# Patient Record
Sex: Female | Born: 1970 | Race: Black or African American | Hispanic: No | Marital: Single | Smoking: Never smoker
Health system: Southern US, Community
[De-identification: ages and names within clinical notes are randomized; demographics above are authoritative.]

## PROBLEM LIST (undated history)

## (undated) DIAGNOSIS — I10 Essential (primary) hypertension: Secondary | ICD-10-CM

## (undated) DIAGNOSIS — B019 Varicella without complication: Secondary | ICD-10-CM

## (undated) HISTORY — DX: Varicella without complication: B01.9

---

## 1999-07-02 ENCOUNTER — Other Ambulatory Visit: Admission: RE | Admit: 1999-07-02 | Discharge: 1999-07-02 | Payer: Self-pay | Admitting: Obstetrics and Gynecology

## 2003-09-05 ENCOUNTER — Other Ambulatory Visit: Admission: RE | Admit: 2003-09-05 | Discharge: 2003-09-05 | Payer: Self-pay | Admitting: Obstetrics and Gynecology

## 2004-04-15 ENCOUNTER — Emergency Department (HOSPITAL_COMMUNITY): Admission: EM | Admit: 2004-04-15 | Discharge: 2004-04-15 | Payer: Self-pay | Admitting: Emergency Medicine

## 2011-11-04 ENCOUNTER — Emergency Department (HOSPITAL_BASED_OUTPATIENT_CLINIC_OR_DEPARTMENT_OTHER): Payer: Self-pay

## 2011-11-04 ENCOUNTER — Encounter (HOSPITAL_BASED_OUTPATIENT_CLINIC_OR_DEPARTMENT_OTHER): Payer: Self-pay | Admitting: *Deleted

## 2011-11-04 ENCOUNTER — Emergency Department (HOSPITAL_BASED_OUTPATIENT_CLINIC_OR_DEPARTMENT_OTHER)
Admission: EM | Admit: 2011-11-04 | Discharge: 2011-11-04 | Disposition: A | Discharge status: Home / Self Care | Payer: Self-pay | Attending: Emergency Medicine | Admitting: Emergency Medicine

## 2011-11-04 DIAGNOSIS — M76899 Other specified enthesopathies of unspecified lower limb, excluding foot: Secondary | ICD-10-CM | POA: Insufficient documentation

## 2011-11-04 DIAGNOSIS — M719 Bursopathy, unspecified: Secondary | ICD-10-CM

## 2011-11-04 MED ORDER — PREDNISONE 10 MG PO TABS: 20.0000 mg | ORAL_TABLET | Freq: Every day | ORAL | Status: DC

## 2011-11-04 NOTE — ED Notes (Signed)
Patient states she has right hip pain for the last 3 months.  No known injury.  States pain has progressively gotten worse, and pain increases with rainy weather.

## 2011-11-05 NOTE — ED Provider Notes (Signed)
History     CSN: [id number]  Arrival date & time 11/04/11  2130   First MD Initiated Contact with Patient 11/04/11 [phone]      Chief Complaint  Patient presents with  . Hip Pain    right    (Consider location/radiation/quality/duration/timing/severity/associated sxs/prior treatment) HPI  Right hip pain for a month.  Pain increases with certain positions.  No recent fall or injury.  No redness, warmth, or swelling.  Patient ambulatory without difficulty.    History reviewed. No pertinent past medical history.  History reviewed. No pertinent past surgical history.  No family history on file.  History  Substance Use Topics  . Smoking status: Never Smoker   . Smokeless tobacco: Not on file  . Alcohol Use: No    OB History    Grav Para Term Preterm Abortions TAB SAB Ect Mult Living                  Review of Systems  All other systems reviewed and are negative.    Allergies  Review of patient's allergies indicates no known allergies.  Home Medications   Current Outpatient Rx  Name Route Sig Dispense Refill  . IBUPROFEN 800 MG PO TABS Oral Take 800 mg by mouth every 8 (eight) hours as needed.    Marland Kitchen PREDNISONE 10 MG PO TABS Oral Take 2 tablets (20 mg total) by mouth daily. 15 tablet 0    BP 132/80  Pulse 79  Temp 98.2 F (36.8 C) (Oral)  Resp 20  Ht 5' (1.524 m)  Wt 219 lb (99.338 kg)  BMI 42.77 kg/m2  SpO2 100%  LMP 10/15/2011  Physical Exam  Nursing note and vitals reviewed. Constitutional: She is oriented to person, place, and time. She appears well-developed and well-nourished.  HENT:  Head: Normocephalic and atraumatic.  Eyes: Conjunctivae and EOM are normal. Pupils are equal, round, and reactive to light.  Neck: Normal range of motion. Neck supple.  Abdominal: Soft. There is no tenderness.  Musculoskeletal:       Some lateral right hip tenderness to palpation.  Full arom right hip, knee, and ankle.  DP and pt 2+.  Sensation intact throughout  lower extremity.    Neurological: She is alert and oriented to person, place, and time.  Skin: Skin is warm and dry.  Psychiatric: She has a normal mood and affect.    ED Course  Procedures (including critical care time)  Labs Reviewed - No data to display Dg Hip Complete Right  11/04/2011  *RADIOLOGY REPORT*  Clinical Data: Right hip pain.  RIGHT HIP - COMPLETE 2+ VIEW  Comparison:  None.  Findings:  There is no evidence of hip fracture or dislocation. There is no evidence of arthropathy or other focal bone abnormality.Pelvic phleboliths.  IMPRESSION: Negative.  Original Report Authenticated By: Elsie Stain, M.D.     1. Bursitis       MDM          Hilario Quarry, MD 11/05/11 [phone]

## 2012-08-16 ENCOUNTER — Ambulatory Visit: Payer: Self-pay | Admitting: Family Medicine

## 2012-09-09 ENCOUNTER — Encounter: Payer: Self-pay | Admitting: Family Medicine

## 2012-09-09 ENCOUNTER — Ambulatory Visit (INDEPENDENT_AMBULATORY_CARE_PROVIDER_SITE_OTHER): Payer: BC Managed Care – PPO | Admitting: Family Medicine

## 2012-09-09 VITALS — BP 150/110 | HR 88 | Temp 99.0°F | Resp 12 | Ht 60.5 in | Wt 220.0 lb

## 2012-09-09 DIAGNOSIS — I10 Essential (primary) hypertension: Secondary | ICD-10-CM | POA: Insufficient documentation

## 2012-09-09 MED ORDER — AMLODIPINE BESYLATE 5 MG PO TABS: 5.0000 mg | ORAL_TABLET | Freq: Every day | ORAL | Status: DC

## 2012-09-09 NOTE — Progress Notes (Signed)
  Subjective:    Patient ID: Danielle Novak, female    DOB: [birth year], 42 y.o.   MRN: [id number]  HPI  Patient here to establish care She has not seen a physician in about 7 years other than urgent care visit for presumed right hip bursitis last summer. Her blood pressure was reportedly elevated then. No significant headaches. No chest pains. No peripheral edema. No nonsteroidal use. No alcohol use.  Patient does not take any medications. No chronic medical problems. No prior surgeries. Family history significant for mother with hypertension.  Patient is single. 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 Nonsmoker. No alcohol.  Past Medical History  Diagnosis Date  . Chicken pox     age 65   History reviewed. No pertinent past surgical history.  reports that she has never smoked. She does not have any smokeless tobacco history on file. She reports that she does not drink alcohol or use illicit drugs. family history includes Diabetes in her maternal grandmother and Hypertension in her mother. No Known Allergies   Review of Systems  Constitutional: Negative for fatigue and unexpected weight change.  Eyes: Negative for visual disturbance.  Respiratory: Negative for cough, chest tightness, shortness of breath and wheezing.   Cardiovascular: Negative for chest pain, palpitations and leg swelling.  Gastrointestinal: Negative for abdominal pain.  Endocrine: Negative for polydipsia and polyuria.  Genitourinary: Negative for dysuria.  Musculoskeletal: Negative for back pain.  Neurological: Negative for dizziness, seizures, syncope, weakness, light-headedness and headaches.  Psychiatric/Behavioral: Negative for dysphoric mood.       Objective:   Physical Exam  Constitutional: She appears well-developed and well-nourished.  Neck: Neck supple. No thyromegaly present.  Cardiovascular: Normal rate and regular rhythm.  Exam reveals no gallop.   Pulmonary/Chest: Effort normal and breath sounds normal. No  respiratory distress. She has no wheezes. She has no rales.  Musculoskeletal: She exhibits no edema.          Assessment & Plan:  Hypertension. Currently untreated. Start amlodipine 5 mg daily. Lose some weight. Schedule complete physical. Watch sodium intake

## 2012-09-09 NOTE — Patient Instructions (Addendum)

## 2012-09-30 ENCOUNTER — Other Ambulatory Visit (INDEPENDENT_AMBULATORY_CARE_PROVIDER_SITE_OTHER): Payer: BC Managed Care – PPO

## 2012-09-30 DIAGNOSIS — Z0000 Encounter for general adult medical examination without abnormal findings: Secondary | ICD-10-CM

## 2012-09-30 LAB — TSH: TSH: 1.68 u[IU]/mL (ref 0.35–5.50)

## 2012-09-30 LAB — CBC WITH DIFFERENTIAL/PLATELET
Basophils Relative: 0.8 % (ref 0.0–3.0)
Eosinophils Absolute: 0 10*3/uL (ref 0.0–0.7)
HCT: 40.1 % (ref 36.0–46.0)
Hemoglobin: 13.2 g/dL (ref 12.0–15.0)
Lymphs Abs: 1.5 10*3/uL (ref 0.7–4.0)
MCHC: 32.8 g/dL (ref 30.0–36.0)
MCV: 79.3 fl (ref 78.0–100.0)
Monocytes Absolute: 0.3 10*3/uL (ref 0.1–1.0)
Neutro Abs: 2.4 10*3/uL (ref 1.4–7.7)
Neutrophils Relative %: 56.7 % (ref 43.0–77.0)
RBC: 5.05 Mil/uL (ref 3.87–5.11)

## 2012-09-30 LAB — POCT URINALYSIS DIPSTICK
Glucose, UA: NEGATIVE
Ketones, UA: NEGATIVE
Leukocytes, UA: NEGATIVE
Spec Grav, UA: 1.02

## 2012-09-30 LAB — BASIC METABOLIC PANEL
BUN: 13 mg/dL (ref 6–23)
Calcium: 8.9 mg/dL (ref 8.4–10.5)
Creatinine, Ser: 0.7 mg/dL (ref 0.4–1.2)
GFR: 111.04 mL/min (ref >60.00)
Glucose, Bld: 114 mg/dL — ABNORMAL HIGH (ref 70–99)

## 2012-09-30 LAB — HEPATIC FUNCTION PANEL
AST: 17 U/L (ref 0–37)
Albumin: 4.2 g/dL (ref 3.5–5.2)
Alkaline Phosphatase: 41 U/L (ref 39–117)
Total Bilirubin: 0.6 mg/dL (ref 0.3–1.2)

## 2012-09-30 LAB — LIPID PANEL: Cholesterol: 199 mg/dL (ref 0–200)

## 2012-10-02 ENCOUNTER — Encounter: Payer: Self-pay | Admitting: Family Medicine

## 2012-10-07 ENCOUNTER — Ambulatory Visit (INDEPENDENT_AMBULATORY_CARE_PROVIDER_SITE_OTHER): Payer: BC Managed Care – PPO | Admitting: Family Medicine

## 2012-10-07 ENCOUNTER — Encounter: Payer: Self-pay | Admitting: Family Medicine

## 2012-10-07 VITALS — BP 138/90 | HR 88 | Temp 99.0°F | Resp 20 | Ht 59.75 in | Wt 220.0 lb

## 2012-10-07 DIAGNOSIS — Z0000 Encounter for general adult medical examination without abnormal findings: Secondary | ICD-10-CM

## 2012-10-07 DIAGNOSIS — Z23 Encounter for immunization: Secondary | ICD-10-CM

## 2012-10-07 NOTE — Patient Instructions (Signed)
Hyperglycemia Hyperglycemia occurs when the glucose (sugar) in your blood is too high. Hyperglycemia can happen for many reasons, but it most often happens to people who do not know they have diabetes or are not managing their diabetes properly.  CAUSES  Whether you have diabetes or not, there are other causes of hyperglycemia. Hyperglycemia can occur when you have diabetes, but it can also occur in other situations that you might not be as aware of, such as: Diabetes  If you have diabetes and are having problems controlling your blood glucose, hyperglycemia could occur because of some of the following reasons:  Not following your meal plan.  Not taking your diabetes medications or not taking it properly.  Exercising less or doing less activity than you normally do.  Being sick. Pre-diabetes  This cannot be ignored. Before people develop Type 2 diabetes, they almost always have "pre-diabetes." This is when your blood glucose levels are higher than normal, but not yet high enough to be diagnosed as diabetes. Research has shown that some long-term damage to the body, especially the heart and circulatory system, may already be occurring during pre-diabetes. If you take action to manage your blood glucose when you have pre-diabetes, you may delay or prevent Type 2 diabetes from developing. Stress  If you have diabetes, you may be "diet" controlled or on oral medications or insulin to control your diabetes. However, you may find that your blood glucose is higher than usual in the hospital whether you have diabetes or not. This is often referred to as "stress hyperglycemia." Stress can elevate your blood glucose. This happens because of hormones put out by the body during times of stress. If stress has been the cause of your high blood glucose, it can be followed regularly by your caregiver. That way he/she can make sure your hyperglycemia does not continue to get worse or progress to  diabetes. Steroids  Steroids are medications that act on the infection fighting system (immune system) to block inflammation or infection. One side effect can be a rise in blood glucose. Most people can produce enough extra insulin to allow for this rise, but for those who cannot, steroids make blood glucose levels go even higher. It is not unusual for steroid treatments to "uncover" diabetes that is developing. It is not always possible to determine if the hyperglycemia will go away after the steroids are stopped. A special blood test called an A1c is sometimes done to determine if your blood glucose was elevated before the steroids were started. SYMPTOMS  Thirsty.  Frequent urination.  Dry mouth.  Blurred vision.  Tired or fatigue.  Weakness.  Sleepy.  Tingling in feet or leg. DIAGNOSIS  Diagnosis is made by monitoring blood glucose in one or all of the following ways:  A1c test. This is a chemical found in your blood.  Fingerstick blood glucose monitoring.  Laboratory results. TREATMENT  First, knowing the cause of the hyperglycemia is important before the hyperglycemia can be treated. Treatment may include, but is not be limited to:  Education.  Change or adjustment in medications.  Change or adjustment in meal plan.  Treatment for an illness, infection, etc.  More frequent blood glucose monitoring.  Change in exercise plan.  Decreasing or stopping steroids.  Lifestyle changes. HOME CARE INSTRUCTIONS   Test your blood glucose as directed.  Exercise regularly. Your caregiver will give you instructions about exercise. Pre-diabetes or diabetes which comes on with stress is helped by exercising.  Eat wholesome,   balanced meals. Eat often and at regular, fixed times. Your caregiver or nutritionist will give you a meal plan to guide your sugar intake.  Being at an ideal weight is important. If needed, losing as little as 10 to 15 pounds may help improve blood  glucose levels. SEEK MEDICAL CARE IF:   You have questions about medicine, activity, or diet.  You continue to have symptoms (problems such as increased thirst, urination, or weight gain). SEEK IMMEDIATE MEDICAL CARE IF:   You are vomiting or have diarrhea.  Your breath smells fruity.  You are breathing faster or slower.  You are very sleepy or incoherent.  You have numbness, tingling, or pain in your feet or hands.  You have chest pain.  Your symptoms get worse even though you have been following your caregiver's orders.  If you have any other questions or concerns. Document Released: 09/23/2000 Document Revised: 06/22/2011 Document Reviewed: 07/27/2011 [facility] Patient Information 2014 [place], [place].  Try to lose some weight Scale back sugars and white starches. Schedule pap smear when not on menses. Schedule mammogram.

## 2012-10-07 NOTE — Progress Notes (Signed)
  Subjective:    Patient ID: Danielle Novak, female    DOB: 02-Aug-1970, 42 y.o.   MRN: [id number]  HPI Patient here for complete physical Hypertension for which we just recently started amlodipine 5 mg daily She has tolerated well with no side effects. She's not had Pap smear in quite some time and is on her menses today She's never had a mammogram. Last tetanus over 10 years.  No history of smoking. No alcohol use. Family history as below  Past Medical History  Diagnosis Date  . Chicken pox     age 16   No past surgical history on file.  reports that she has never smoked. She does not have any smokeless tobacco history on file. She reports that she does not drink alcohol or use illicit drugs. family history includes Diabetes in her maternal grandmother and Hypertension in her mother. No Known Allergies    Review of Systems  Constitutional: Negative for fever, activity change, appetite change, fatigue and unexpected weight change.  HENT: Negative for hearing loss, ear pain, sore throat and trouble swallowing.   Eyes: Negative for visual disturbance.  Respiratory: Negative for cough and shortness of breath.   Cardiovascular: Negative for chest pain and palpitations.  Gastrointestinal: Negative for abdominal pain, diarrhea, constipation and blood in stool.  Genitourinary: Negative for dysuria and hematuria.  Musculoskeletal: Negative for myalgias, back pain and arthralgias.  Skin: Negative for rash.  Neurological: Negative for dizziness, syncope and headaches.  Hematological: Negative for adenopathy.  Psychiatric/Behavioral: Negative for confusion and dysphoric mood.       Objective:   Physical Exam  Constitutional: She appears well-developed and well-nourished.  HENT:  Right Ear: External ear normal.  Left Ear: External ear normal.  Mouth/Throat: Oropharynx is clear and moist.  Neck: Neck supple. No thyromegaly present.  Cardiovascular: Normal rate and regular rhythm.    Pulmonary/Chest: Effort normal and breath sounds normal. No respiratory distress. She has no wheezes. She has no rales.  Abdominal: Soft. Bowel sounds are normal. She exhibits no distension and no mass. There is no tenderness. There is no rebound and no guarding.  Musculoskeletal: She exhibits no edema.  Lymphadenopathy:    She has no cervical adenopathy.  Skin: No rash noted.          Assessment & Plan:  Complete physical. Labs reviewed. She has prediabetes. Dyslipidemia. Strongly recommended weight loss programs. We discussed strategies. Reassess fasting blood sugar and blood pressure within 3 months. She will either schedule gynecologist or return here for Pap smear when not on menses. Schedule mammogram. Tetanus booster given

## 2013-02-16 ENCOUNTER — Other Ambulatory Visit: Payer: Self-pay

## 2013-06-09 IMAGING — CR DG HIP COMPLETE 2+V*R*
3 series · 3 of 3 positions shown · non-contrast
Comparison: None.

CLINICAL DATA: Right hip pain.

RIGHT HIP - COMPLETE 2+ VIEW

[t pelvis a.p.]
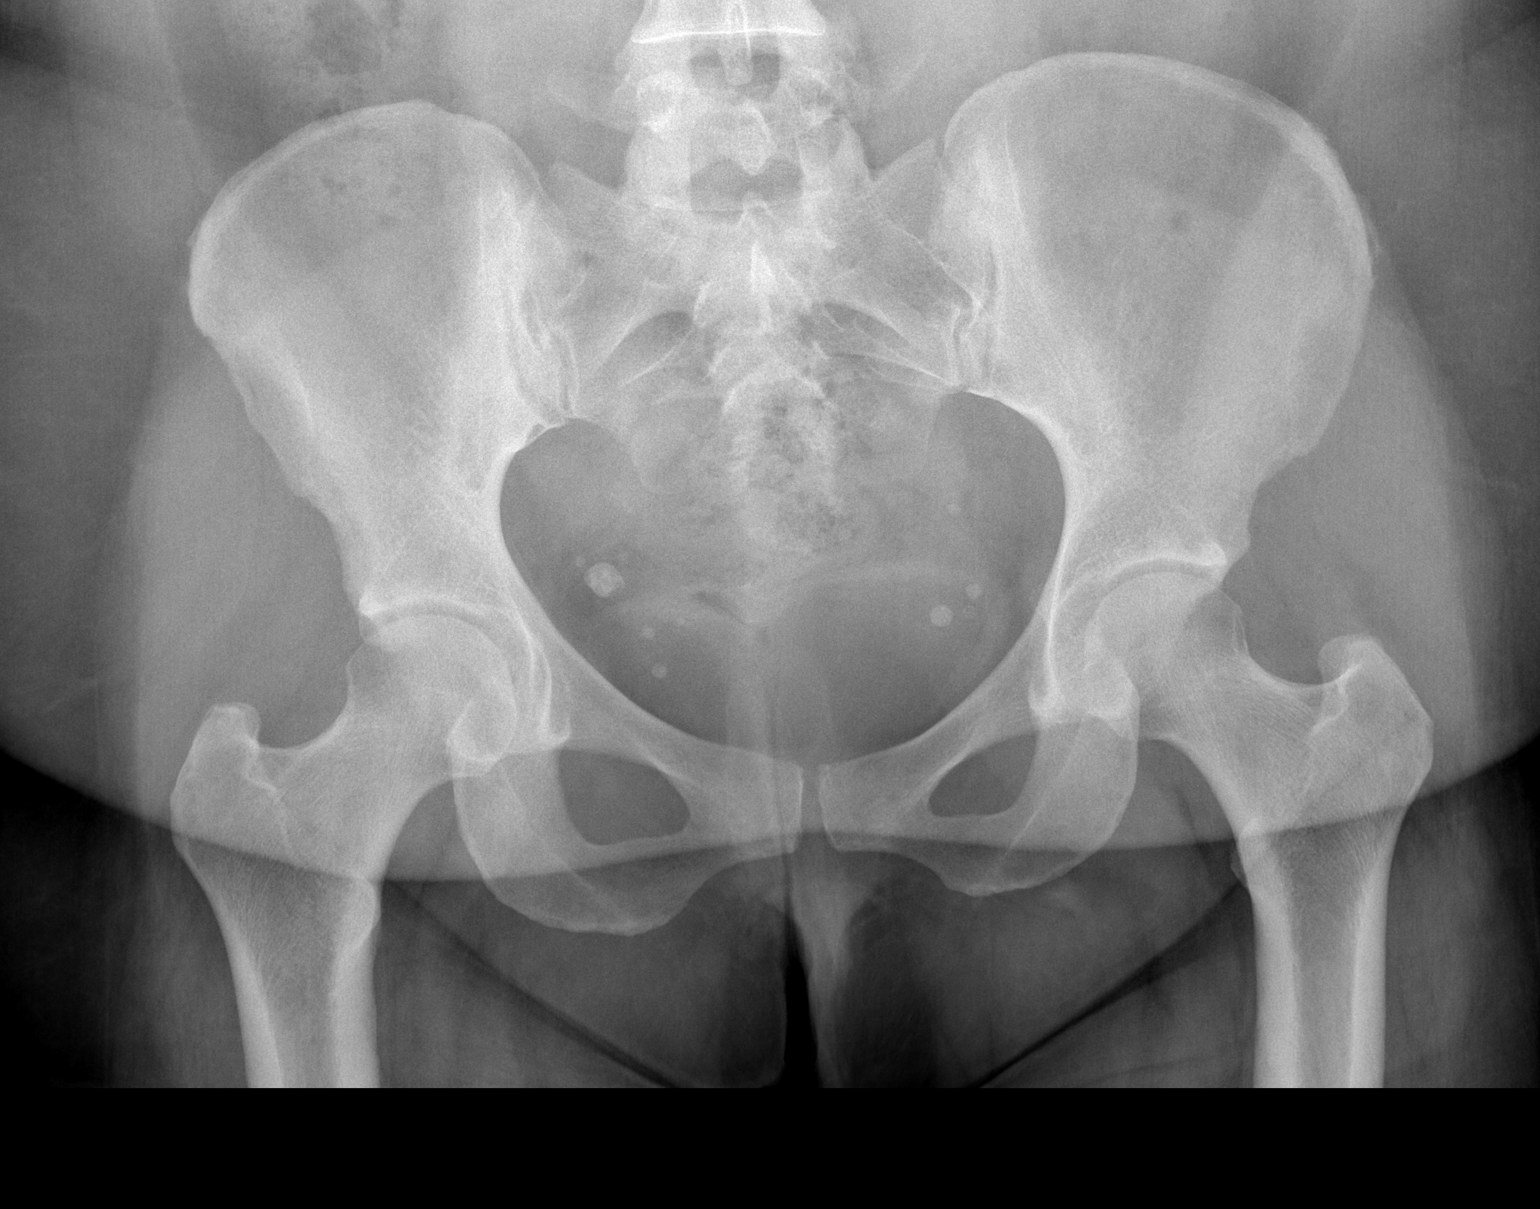

[t hip ap right]
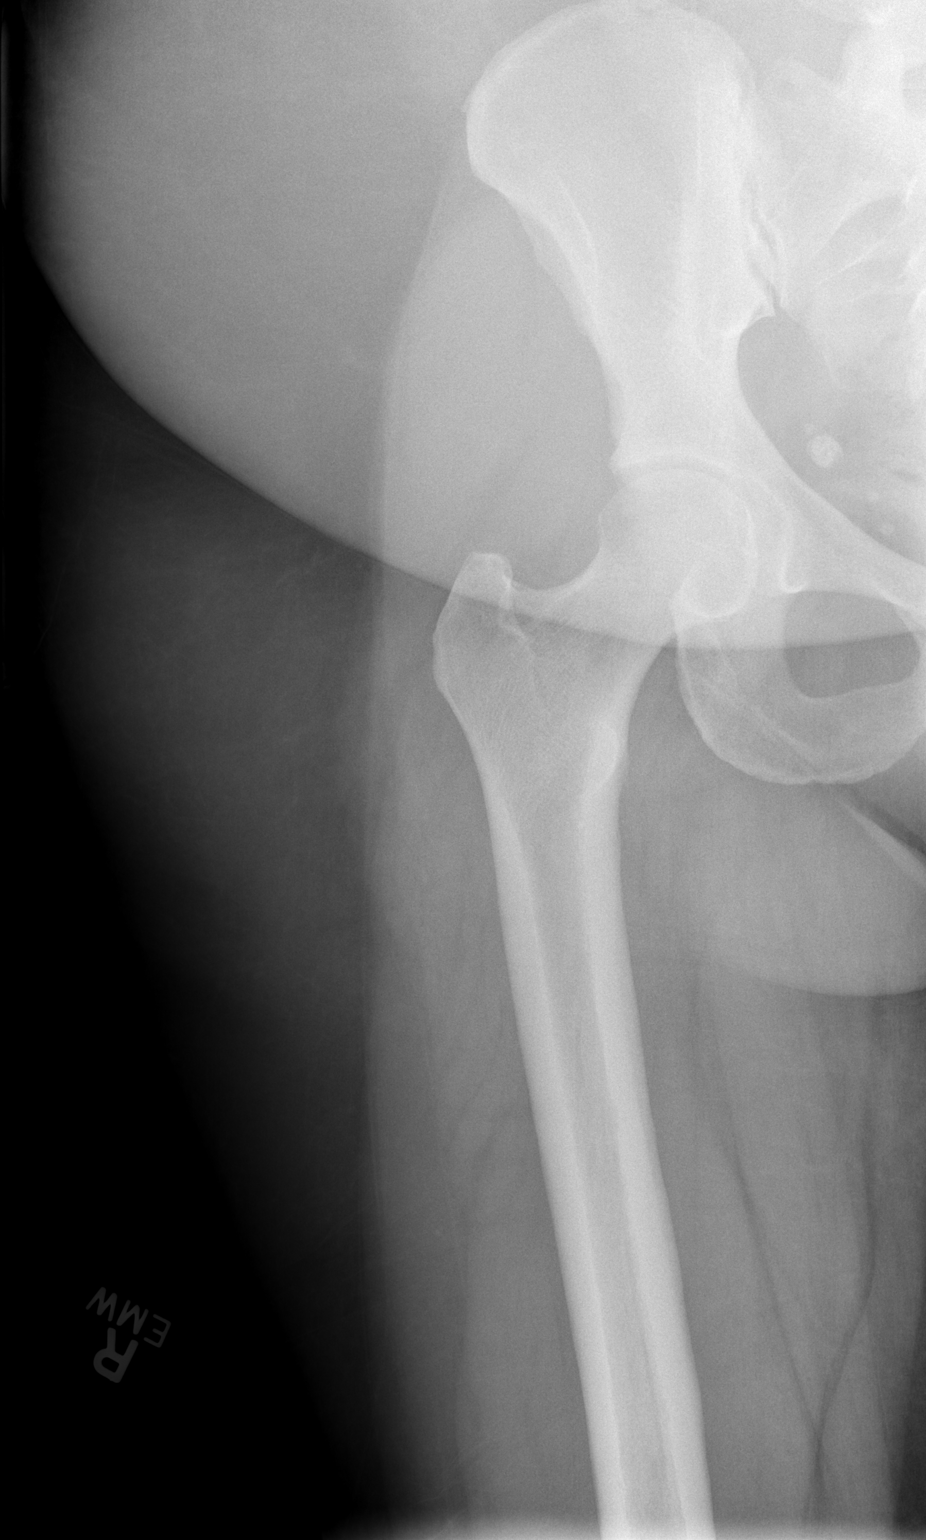

[t hip frog leg right]
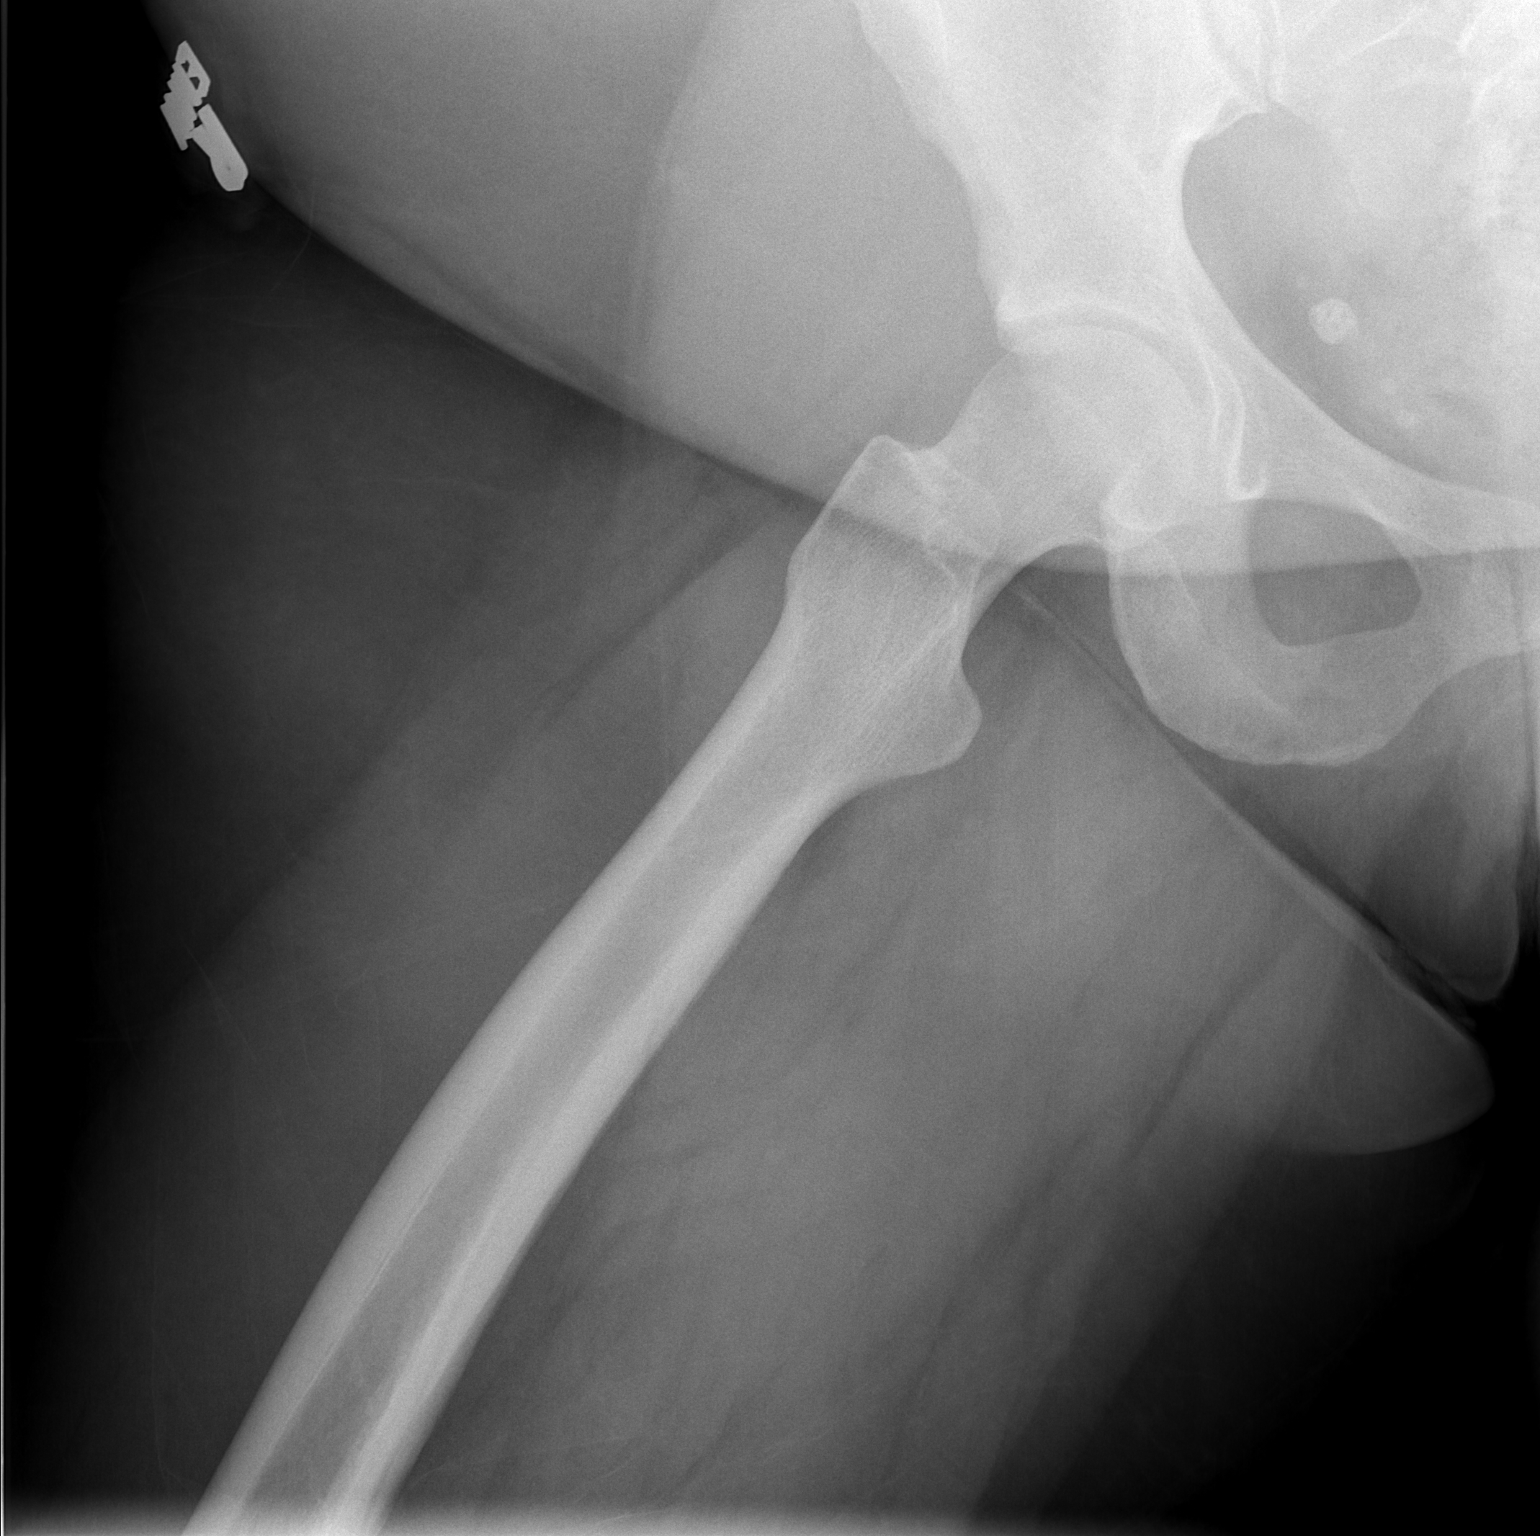

[3 of 3 positions shown; findings below may reference images not displayed]

FINDINGS: There is no evidence of hip fracture or dislocation.
There is no evidence of arthropathy or other focal bone
abnormality.Pelvic phleboliths.
IMPRESSION: Negative.

## 2013-06-30 ENCOUNTER — Ambulatory Visit (INDEPENDENT_AMBULATORY_CARE_PROVIDER_SITE_OTHER): Payer: BC Managed Care – PPO | Admitting: Family Medicine

## 2013-06-30 ENCOUNTER — Encounter: Payer: Self-pay | Admitting: Family Medicine

## 2013-06-30 VITALS — BP 140/90 | HR 89 | Wt 227.0 lb

## 2013-06-30 DIAGNOSIS — M7741 Metatarsalgia, right foot: Secondary | ICD-10-CM

## 2013-06-30 DIAGNOSIS — M7750 Other enthesopathy of unspecified foot: Secondary | ICD-10-CM

## 2013-06-30 NOTE — Progress Notes (Signed)
Pre visit review using our clinic review tool, if applicable. No additional management support is needed unless otherwise documented below in the visit note. 

## 2013-06-30 NOTE — Progress Notes (Signed)
   Subjective:    Patient ID: Danielle Novak, female    DOB: [birth year], 43 y.o.   MRN: [id number]  Foot Pain Pertinent negatives include no rash.   Right foot pain for 2 months. Location is metatarsal region of right foot. No injury. She got some new running shoes 2 days ago they think of helps slightly. She has not tried any metatarsal pads or orthotics. Region of pain is metatarsal heads. Denies any interdigital pain. No numbness or tingling. Has not noted any visible swelling. No dorsal foot pain. She denies any calluses or plantar warts.  Past Medical History  Diagnosis Date  . Chicken pox     age 43   No past surgical history on file.  reports that she has never smoked. She does not have any smokeless tobacco history on file. She reports that she does not drink alcohol or use illicit drugs. family history includes Diabetes in her maternal grandmother; Hypertension in her mother. No Known Allergies    Review of Systems  Musculoskeletal: Negative for gait problem.  Skin: Negative for rash.       Objective:   Physical Exam  Constitutional: She appears well-developed and well-nourished.  Cardiovascular: Normal rate.   Pulmonary/Chest: Effort normal and breath sounds normal. No respiratory distress. She has no wheezes. She has no rales.  Musculoskeletal:  Right foot reveals no edema. Good distal foot pulses. No erythema. No warmth. She has some nonspecific mild tenderness of the metatarsal heads-second through fourth metatarsals.. No interdigital tenderness. No dorsal foot tenderness.          Assessment & Plan:  Right foot pain. Suspect metatarsalgia. We've recommended over-the-counter metatarsal pad. Consider x-rays if not improving with that. Consider podiatry referral if symptoms persist

## 2013-06-30 NOTE — Patient Instructions (Signed)
Consider over the counter metatarsal pad for foot pain. Try Allegra for allergies and add Flonase or Nasacort if necessary.

## 2013-09-11 ENCOUNTER — Ambulatory Visit (INDEPENDENT_AMBULATORY_CARE_PROVIDER_SITE_OTHER)
Admission: RE | Admit: 2013-09-11 | Discharge: 2013-09-11 | Disposition: A | Discharge status: Home / Self Care | Payer: BC Managed Care – PPO | Source: Ambulatory Visit | Attending: Physician Assistant | Admitting: Physician Assistant

## 2013-09-11 ENCOUNTER — Ambulatory Visit (INDEPENDENT_AMBULATORY_CARE_PROVIDER_SITE_OTHER): Payer: BC Managed Care – PPO | Admitting: Physician Assistant

## 2013-09-11 ENCOUNTER — Telehealth: Payer: Self-pay | Admitting: Physician Assistant

## 2013-09-11 ENCOUNTER — Encounter: Payer: Self-pay | Admitting: Physician Assistant

## 2013-09-11 VITALS — BP 150/100 | HR 88 | Temp 99.4°F | Resp 12 | Wt 217.0 lb

## 2013-09-11 DIAGNOSIS — S99929A Unspecified injury of unspecified foot, initial encounter: Secondary | ICD-10-CM

## 2013-09-11 DIAGNOSIS — S99919A Unspecified injury of unspecified ankle, initial encounter: Secondary | ICD-10-CM

## 2013-09-11 DIAGNOSIS — S8990XA Unspecified injury of unspecified lower leg, initial encounter: Secondary | ICD-10-CM

## 2013-09-11 DIAGNOSIS — S82899A Other fracture of unspecified lower leg, initial encounter for closed fracture: Secondary | ICD-10-CM

## 2013-09-11 NOTE — Telephone Encounter (Signed)
Pt called to give fax number for work note.. Need to be faxed to [phone]

## 2013-09-11 NOTE — Progress Notes (Signed)
Subjective:    Patient ID: Danielle Novak, female    DOB: [birth year], 43 y.o.   MRN: [id number]  Ankle Injury  The incident occurred more than 1 week ago (3 weeks ago). The incident occurred at work. The injury mechanism was an inversion injury (stepped down from chair and rolled ankle.). The pain is present in the left ankle. Quality: Throbbing. The pain is at a severity of 6/10. The pain is moderate. The pain has been fluctuating since onset. Pertinent negatives include no inability to bear weight, loss of motion, loss of sensation, muscle weakness, numbness or tingling. She reports no foreign bodies present. The symptoms are aggravated by movement (certain movements, not always consistent. Mostly with dorsiflexion and inversion/eversion.). She has tried NSAIDs and ice for the symptoms. The treatment provided significant (Ice helped decrease the swelling. Advil helped pain relief.) relief.      Review of Systems  Constitutional: Negative for fever and chills.  Respiratory: Negative for shortness of breath.   Gastrointestinal: Negative for nausea, vomiting and diarrhea.  Musculoskeletal: Positive for joint swelling (has improved gradually over 3 weeks.). Negative for gait problem.  Neurological: Negative for tingling, numbness and headaches.  All other systems reviewed and are negative.   Past Medical History  Diagnosis Date  . Chicken pox     age 43   History reviewed. No pertinent past surgical history.  reports that she has never smoked. She does not have any smokeless tobacco history on file. She reports that she does not drink alcohol or use illicit drugs. family history includes Diabetes in her maternal grandmother; Hypertension in her mother. No Known Allergies     Objective:   Physical Exam  Nursing note and vitals reviewed. Constitutional: She is oriented to person, place, and time. She appears well-developed and well-nourished. No distress.  HENT:  Head: Normocephalic  and atraumatic.  Eyes: Conjunctivae and EOM are normal. Pupils are equal, round, and reactive to light.  Neck: Normal range of motion. Neck supple.  Cardiovascular: Normal rate, regular rhythm, normal heart sounds and intact distal pulses.  Exam reveals no gallop and no friction rub.   No murmur heard. Pulmonary/Chest: Effort normal and breath sounds normal. No respiratory distress. She has no wheezes. She has no rales. She exhibits no tenderness.  Musculoskeletal: Normal range of motion. She exhibits edema (mild edema to the left ankle.). She exhibits no tenderness (no pain to palpation of the ankle or foot bones.).  NROM, no tenderness with PROM or AROM against resistance.  Neurological: She is alert and oriented to person, place, and time. She has normal reflexes. She displays normal reflexes. She exhibits normal muscle tone. Coordination normal.  Gait normal.  Skin: Skin is warm and dry. No rash noted. She is not diaphoretic. No erythema. No pallor.  Psychiatric: She has a normal mood and affect. Her behavior is normal. Judgment and thought content normal.    Filed Vitals:   09/11/13 1100 09/11/13 1145  BP: 160/120 150/100  Pulse: 88   Temp: 99.4 F (37.4 C)   TempSrc: Oral   Resp: 12   Weight: 217 lb (98.431 kg)    Lab Results  Component Value Date   WBC 4.3* 09/30/2012   HGB 13.2 09/30/2012   HCT 40.1 09/30/2012   PLT 233.0 09/30/2012   GLUCOSE 114* 09/30/2012   CHOL 199 09/30/2012   TRIG 139.0 09/30/2012   HDL 39.30 09/30/2012   LDLCALC 132* 09/30/2012   ALT 20 09/30/2012  AST 17 09/30/2012   NA 135 09/30/2012   K 4.3 09/30/2012   CL 104 09/30/2012   CREATININE 0.7 09/30/2012   BUN 13 09/30/2012   CO2 24 09/30/2012   TSH 1.68 09/30/2012        Assessment & Plan:  Danielle Novak was seen today for ankle injury.  Diagnoses and associated orders for this visit:  Ankle injury Comments: 59 weeks old. Get Xray, RICE and NSAIDs for now. Instructed pt that even mild soft tissue injruies may  take weeks to heal. - DG Ankle Complete Left; Future   BP was 160/120 at first which pt thought was due to stress/anxiety of ankle injury, was rechecked at end of visit and was 150/100. Pt is currently asymptomatic. Pt is currently being treated for HTN. Pt states that normally her BPs are less than 140/90.  Admits compliance with medication. Will have pt monitor BPs at home, and will reassess at future visit.  Return precautions provided.  Plan to follow up as needed. Other plans pending Xray results.  Patient Instructions  We will call with the results of the x-ray when they are available.  Rest, ice, compression, elevation, and NSAIDs should help relieve your symptoms. This may still take several weeks to fully heal. Soft tissue injuries like mild ankle sprains take a long time to heal completely.  Followup as needed or if symptoms worsen or fail to improve despite treatment.

## 2013-09-11 NOTE — Telephone Encounter (Signed)
Call pt. Relayed results of ankle Xray. May need more support to heal properly. Refer to Sports Medicine. Also requesting work note.

## 2013-09-11 NOTE — Patient Instructions (Signed)
We will call with the results of the x-ray when they are available.  Rest, ice, compression, elevation, and NSAIDs should help relieve your symptoms. This may still take several weeks to fully heal. Soft tissue injuries like mild ankle sprains take a long time to heal completely.  Followup as needed or if symptoms worsen or fail to improve despite treatment.   RICE: Routine Care for Injuries The routine care of many injuries includes Rest, Ice, Compression, and Elevation (RICE). HOME CARE INSTRUCTIONS  Rest is needed to allow your body to heal. Routine activities can usually be resumed when comfortable. Injured tendons and bones can take up to 6 weeks to heal. Tendons are the cord-like structures that attach muscle to bone.  Ice following an injury helps keep the swelling down and reduces pain.  Put ice in a plastic bag.  Place a towel between your skin and the bag.  Leave the ice on for 15-20 minutes, 03-04 times a day. Do this while awake, for the first 24 to 48 hours. After that, continue as directed by your caregiver.  Compression helps keep swelling down. It also gives support and helps with discomfort. If an elastic bandage has been applied, it should be removed and reapplied every 3 to 4 hours. It should not be applied tightly, but firmly enough to keep swelling down. Watch fingers or toes for swelling, bluish discoloration, coldness, numbness, or excessive pain. If any of these problems occur, remove the bandage and reapply loosely. Contact your caregiver if these problems continue.  Elevation helps reduce swelling and decreases pain. With extremities, such as the arms, hands, legs, and feet, the injured area should be placed near or above the level of the heart, if possible. SEEK IMMEDIATE MEDICAL CARE IF:  You have persistent pain and swelling.  You develop redness, numbness, or unexpected weakness.  Your symptoms are getting worse rather than improving after several  days. These symptoms may indicate that further evaluation or further X-rays are needed. Sometimes, X-rays may not show a small broken bone (fracture) until 1 week or 10 days later. Make a follow-up appointment with your caregiver. Ask when your X-ray results will be ready. Make sure you get your X-ray results. Document Released: 07/12/2000 Document Revised: 06/22/2011 Document Reviewed: 08/29/2010 [facility] Patient Information 2014 [place]. Elastic Bandage and RICE Elastic bandages come in different shapes and sizes. They perform different functions. Your caregiver will help you to decide what is best for your protection, recovery, or rehabilitation following an injury. The following are some general tips to help you use an elastic bandage.  Use the bandage as directed by the maker of the bandage you are using.  Do not wrap it too tight. This may cut off the circulation of the arm or leg below the bandage.  If part of your body beyond the bandage becomes blue, numb, or swollen, it is too tight. Loosen the bandage as needed to prevent these problems.  See your caregiver or trainer if the bandage seems to be making your problems worse rather than better. Bandages may be a reminder to you that you have an injury. However, they provide very little support. The few pounds of support they provide are minor considering the pressure it takes to injure a joint or tear ligaments. Therefore, the joint will not be able to handle all of the wear and tear it could before the injury. The routine care of many injuries includes Rest, Ice, Compression, and Elevation (RICE).  Rest  is required to allow your body to heal. Generally, routine activities can be resumed when comfortable. Injured tendons and bones take about 6 weeks to heal.  Icing the injury helps keep the swelling down and reduces pain. Do not apply ice directly to the skin. Put ice in a plastic bag. Place a towel between the skin and the bag.  This will prevent frostbite to the skin. Apply ice bags to the injured area for 15-20 minutes, every 2 hours while awake. Do this for the first 24 to 48 hours, then as directed by your caregiver.  Compression helps keep swelling down, gives support, and helps with discomfort. If an elastic bandage has been applied today, it should be removed and reapplied every 3 to 4 hours. It should not be applied tightly, but firmly enough to keep swelling down. Watch fingers or toes for swelling, bluish discoloration, coldness, numbness, or increased pain. If any of these problems occur, remove the bandage and reapply it more loosely. If these problems persist, contact your caregiver.  Elevation helps reduce swelling and decreases pain. The injured area (arms, hands, legs, or feet) should be placed near to or above the heart (center of the chest) if able. Persistent pain and inability to use the injured area for more than 2 to 3 days are warning signs. You should see a caregiver for a follow-up visit as soon as possible. Initially, a minor broken bone (hairline fracture) may not be seen on X-rays. It may take 7 to 10 days to finally show up. Continued pain and swelling show that further evaluation and/or X-rays are needed. Make a follow-up visit with your caregiver. A specialist in reading X-rays (radiologist) will read your X-rays again. Finding out the results of your test Not all test results are available during your visit. If your test results are not back during the visit, make an appointment with your caregiver to find out the results. Do not assume everything is normal if you have not heard from your caregiver or the medical facility. It is important for you to follow up on all of your test results. Document Released: 09/19/2001 Document Revised: 06/22/2011 Document Reviewed: 08/01/2007 [facility] Patient Information 2014 [place], [place].

## 2013-09-11 NOTE — Telephone Encounter (Signed)
Faxed and confirmation received.

## 2013-09-11 NOTE — Progress Notes (Signed)
Pre visit review using our clinic review tool, if applicable. No additional management support is needed unless otherwise documented below in the visit note. 

## 2013-09-20 ENCOUNTER — Other Ambulatory Visit (INDEPENDENT_AMBULATORY_CARE_PROVIDER_SITE_OTHER): Payer: BC Managed Care – PPO

## 2013-09-20 ENCOUNTER — Ambulatory Visit (INDEPENDENT_AMBULATORY_CARE_PROVIDER_SITE_OTHER): Payer: BC Managed Care – PPO | Admitting: Family Medicine

## 2013-09-20 ENCOUNTER — Encounter: Payer: Self-pay | Admitting: Family Medicine

## 2013-09-20 VITALS — BP 152/92 | HR 91 | Ht 60.0 in | Wt 219.0 lb

## 2013-09-20 DIAGNOSIS — S8263XA Displaced fracture of lateral malleolus of unspecified fibula, initial encounter for closed fracture: Secondary | ICD-10-CM

## 2013-09-20 DIAGNOSIS — M25572 Pain in left ankle and joints of left foot: Secondary | ICD-10-CM

## 2013-09-20 DIAGNOSIS — M25579 Pain in unspecified ankle and joints of unspecified foot: Secondary | ICD-10-CM

## 2013-09-20 DIAGNOSIS — S8262XA Displaced fracture of lateral malleolus of left fibula, initial encounter for closed fracture: Secondary | ICD-10-CM

## 2013-09-20 NOTE — Assessment & Plan Note (Signed)
Patient is near healed now 4 weeks after injury even without rehabilitation. Patient though does have tightening of the deltoid ligament on the medial aspect of the ankle secondary to her lateral ankle fracture. Patient encouraged to do exercises 3-4 times a week and was given a stability brace that was fitted by me today. We discussed icing protocol and over-the-counter anti-inflammatories he can be beneficial. He should was given a slow progression to start becoming more active over the course of the next 3 weeks. Patient will followup in 3 weeks for further evaluation.

## 2013-09-20 NOTE — Progress Notes (Signed)
Danielle ScaleZach Ashlei Novak D.O. [facility] [address] [place], [place] [address] Phone: [phone]) [phone] Subjective:    I'm seeing this patient by the request  of:  Danielle Novak,Danielle W, MD   CC:  Left ankle fracture.  BJY:[id number]:Subjective Danielle FeelingOyana M Novak is a 43 y.o. female coming in with complaint of left ankle pain. Patient 4 weeks ago was injured the left ankle. Patient did have an inversion ankle sprain while she was at work. Patient did have swelling immediately and had trouble with weightbearing. Over the course of time and had started getting better. Patient still complains of some medial ankle pain. Patient feels it is more tight and still has some swelling. Patient is the primary care provider and he did get an x-ray that showed patient did have a avulsion fracture of the lateral malleolus. Patient states that she is able to angulate nail but just has more of a soreness on the inside. This being has not allowed her to start going back to the gym but she is able to do work and is able to sit comfortably. Does not take any medications on a regular basis. Has not tried any other modalities. Patient states that the severity of the pain is 4/10 on a daily basis.    X-rays reviewed by me. X-rays taken June 1 showed the patient does have a very tiny avulsion fracture off the inferior lateral aspect of the fibula. Otherwise fairly unremarkable. Past medical history, social, surgical and family history all reviewed in electronic medical record.   Review of Systems: No headache, visual changes, nausea, vomiting, diarrhea, constipation, dizziness, abdominal pain, skin rash, fevers, chills, night sweats, weight loss, swollen lymph nodes, body aches, joint swelling, muscle aches, chest pain, shortness of breath, mood changes.   Objective Blood pressure 152/92, pulse 91, height 5' (1.524 m), weight 219 lb (99.338 kg), last menstrual period 08/30/2013, SpO2 97.00%.  General: No apparent distress alert and  oriented x3 mood and affect normal, dressed appropriately.  HEENT: Pupils equal, extraocular movements intact  Respiratory: Patient's speak in full sentences and does not appear short of breath  Cardiovascular: No lower extremity edema, non tender, no erythema  Skin: Warm dry intact with no signs of infection or rash on extremities or on axial skeleton.  Abdomen: Soft nontender  Neuro: Cranial nerves II through XII are intact, neurovascularly intact in all extremities with 2+ DTRs and 2+ pulses.  Lymph: No lymphadenopathy of posterior or anterior cervical chain or axillae bilaterally.  Gait normal with good balance and coordination.  MSK:  Non tender with full range of motion and good stability and symmetric strength and tone of shoulders, elbows, wrist, hip, knees bilaterally.  Ankle: Left Trace effusion noted compared to the contralateral side. Range of motion is full in all directions. Strength is 5/5 in all directions. Stable lateral and medial ligaments; squeeze test and kleiger test unremarkable; Talar dome nontender; No pain at base of 5th MT; No tenderness over cuboid; No tenderness over N spot or navicular prominence Patient is still minimally tender over the deltoid ligament as well as the inferior lateral malleolus. No sign of peroneal tendon subluxations or tenderness to palpation Negative tarsal tunnel tinel's Able to walk 4 steps.  MSK US performed of: Left ankle This study was ordered, performed, and interpreted by Danielle Novak D.O.  Foot/Ankle:   All structures visualized.   Talar dome unremarkable the patient does have a trace effusion of the ankle to Peroneus longus and brevis tendons unremarkable  on long and transverse views without sheath effusions. Posterior tibialis, flexor hallucis longus, and flexor digitorum longus tendons unremarkable on long and transverse views without sheath effusions. Overlying soft tissue edema noted. Achilles tendon visualized along length  of tendon and unremarkable on long and transverse views without sheath effusion. Anterior Talofibular Ligament does have hypoechoic changes does appear to be intact. Patient does have what appears to be a callus formation over the inferior lateral aspect of the lateral malleolus. Seems to be well-healed.   Calcaneofibular Ligaments unremarkable and intact. Deltoid Ligament unremarkable and intact. Plantar fascia intact and without effusion, normal thickness. No increased doppler signal, cap sign, or thickening of tibial cortex. Power doppler signal normal.  IMPRESSION: Near complete healed of avulsion fracture of lateral malleolus.      Impression and Recommendations:     This case required medical decision making of moderate complexity.

## 2013-09-20 NOTE — Patient Instructions (Signed)
Very nice to meet you Wear brace daily for another week then with exercise only for another 3 weeks.  Ok to bike and swim this week, next week start elliptical and in 2 week scan start classes again but in brace.  Ice bath 20 minutes 2 times a day.  Vitamin D 2000 IU daily.  Exercises 3-4 times a week See me again in 3 weeks.

## 2013-10-04 ENCOUNTER — Other Ambulatory Visit (INDEPENDENT_AMBULATORY_CARE_PROVIDER_SITE_OTHER): Payer: BC Managed Care – PPO

## 2013-10-04 DIAGNOSIS — Z0000 Encounter for general adult medical examination without abnormal findings: Secondary | ICD-10-CM

## 2013-10-04 LAB — CBC WITH DIFFERENTIAL/PLATELET
Basophils Absolute: 0 10*3/uL (ref 0.0–0.1)
Basophils Relative: 0.5 % (ref 0.0–3.0)
EOS PCT: 1.6 % (ref 0.0–5.0)
Eosinophils Absolute: 0.1 10*3/uL (ref 0.0–0.7)
HEMATOCRIT: 36.5 % (ref 36.0–46.0)
Hemoglobin: 11.7 g/dL — ABNORMAL LOW (ref 12.0–15.0)
Lymphocytes Relative: 39 % (ref 12.0–46.0)
Lymphs Abs: 1.9 10*3/uL (ref 0.7–4.0)
MCHC: 32.1 g/dL (ref 30.0–36.0)
MCV: 72.7 fl — ABNORMAL LOW (ref 78.0–100.0)
MONOS PCT: 7.1 % (ref 3.0–12.0)
Monocytes Absolute: 0.3 10*3/uL (ref 0.1–1.0)
NEUTROS PCT: 51.8 % (ref 43.0–77.0)
Neutro Abs: 2.5 10*3/uL (ref 1.4–7.7)
PLATELETS: 263 10*3/uL (ref 150.0–400.0)
RBC: 5.03 Mil/uL (ref 3.87–5.11)
RDW: 17.6 % — ABNORMAL HIGH (ref 11.5–15.5)
WBC: 4.9 10*3/uL (ref 4.0–10.5)

## 2013-10-04 LAB — HEPATIC FUNCTION PANEL
ALT: 17 U/L (ref 0–35)
AST: 20 U/L (ref 0–37)
Albumin: 4.1 g/dL (ref 3.5–5.2)
Alkaline Phosphatase: 36 U/L — ABNORMAL LOW (ref 39–117)
BILIRUBIN DIRECT: 0 mg/dL (ref 0.0–0.3)
BILIRUBIN TOTAL: 0.3 mg/dL (ref 0.2–1.2)
Total Protein: 7.1 g/dL (ref 6.0–8.3)

## 2013-10-04 LAB — POCT URINALYSIS DIPSTICK
BILIRUBIN UA: NEGATIVE
Glucose, UA: NEGATIVE
KETONES UA: NEGATIVE
Leukocytes, UA: NEGATIVE
Nitrite, UA: NEGATIVE
PH UA: 5
Protein, UA: NEGATIVE
RBC UA: NEGATIVE
Spec Grav, UA: 1.025
Urobilinogen, UA: 0.2

## 2013-10-04 LAB — LIPID PANEL
CHOL/HDL RATIO: 5
Cholesterol: 195 mg/dL (ref 0–200)
HDL: 41 mg/dL (ref >39.00)
LDL Cholesterol: 111 mg/dL — ABNORMAL HIGH (ref 0–99)
NONHDL: 154
Triglycerides: 215 mg/dL — ABNORMAL HIGH (ref 0.0–149.0)
VLDL: 43 mg/dL — AB (ref 0.0–40.0)

## 2013-10-04 LAB — BASIC METABOLIC PANEL
BUN: 16 mg/dL (ref 6–23)
CO2: 25 mEq/L (ref 19–32)
CREATININE: 0.8 mg/dL (ref 0.4–1.2)
Calcium: 8.9 mg/dL (ref 8.4–10.5)
Chloride: 104 mEq/L (ref 96–112)
GFR: 96.79 mL/min (ref >60.00)
Glucose, Bld: 105 mg/dL — ABNORMAL HIGH (ref 70–99)
POTASSIUM: 4.1 meq/L (ref 3.5–5.1)
Sodium: 136 mEq/L (ref 135–145)

## 2013-10-04 LAB — TSH: TSH: 2.05 u[IU]/mL (ref 0.35–4.50)

## 2013-10-09 ENCOUNTER — Ambulatory Visit (INDEPENDENT_AMBULATORY_CARE_PROVIDER_SITE_OTHER): Payer: BC Managed Care – PPO | Admitting: Family Medicine

## 2013-10-09 ENCOUNTER — Other Ambulatory Visit (HOSPITAL_COMMUNITY)
Admission: RE | Admit: 2013-10-09 | Discharge: 2013-10-09 | Disposition: A | Discharge status: Home / Self Care | Payer: BC Managed Care – PPO | Source: Ambulatory Visit | Attending: Family Medicine | Admitting: Family Medicine

## 2013-10-09 ENCOUNTER — Encounter: Payer: Self-pay | Admitting: Family Medicine

## 2013-10-09 VITALS — BP 140/98 | HR 91 | Temp 98.6°F | Ht 60.0 in | Wt 224.0 lb

## 2013-10-09 DIAGNOSIS — Z01419 Encounter for gynecological examination (general) (routine) without abnormal findings: Secondary | ICD-10-CM | POA: Insufficient documentation

## 2013-10-09 DIAGNOSIS — D508 Other iron deficiency anemias: Secondary | ICD-10-CM

## 2013-10-09 DIAGNOSIS — Z0000 Encounter for general adult medical examination without abnormal findings: Secondary | ICD-10-CM

## 2013-10-09 MED ORDER — AMLODIPINE BESYLATE 5 MG PO TABS: 5.0000 mg | ORAL_TABLET | Freq: Every day | ORAL | Status: DC

## 2013-10-09 NOTE — Patient Instructions (Signed)
Schedule mammogram Complete Hemoccult cards Takes over-the-counter iron sulfate 325 mg once daily Return in one month for repeat complete blood count Try to lose some weight We will call you regarding Pap smear results

## 2013-10-09 NOTE — Progress Notes (Signed)
   Subjective:    Patient ID: Danielle Novak, female    DOB: 1971-03-02, 43 y.o.   MRN: [id number]  HPI Here for complete physical. She has hypertension treated with amlodipine. She ran out of her medication recently and has been off her meds for a few days. She's not been exercising since leg injury several months ago. She's not had Pap smear in about 6 years. She's never had a mammogram. Her menses are fairly regular. Tetanus is up to date. Never smoked  Past Medical History  Diagnosis Date  . Chicken pox     age 43   No past surgical history on file.  reports that she has never smoked. She does not have any smokeless tobacco history on file. She reports that she does not drink alcohol or use illicit drugs. family history includes Diabetes in her maternal grandmother; Hypertension in her mother. No Known Allergies    Review of Systems  Constitutional: Negative for fever, activity change, appetite change, fatigue and unexpected weight change.  HENT: Negative for ear pain, hearing loss, sore throat and trouble swallowing.   Eyes: Negative for visual disturbance.  Respiratory: Negative for cough and shortness of breath.   Cardiovascular: Negative for chest pain and palpitations.  Gastrointestinal: Negative for abdominal pain, diarrhea, constipation and blood in stool.  Genitourinary: Negative for dysuria and hematuria.  Musculoskeletal: Negative for arthralgias, back pain and myalgias.  Skin: Negative for rash.  Neurological: Negative for dizziness, syncope and headaches.  Hematological: Negative for adenopathy.  Psychiatric/Behavioral: Negative for confusion and dysphoric mood.       Objective:   Physical Exam  Constitutional: She is oriented to person, place, and time. She appears well-developed and well-nourished.  HENT:  Head: Normocephalic and atraumatic.  Eyes: EOM are normal. Pupils are equal, round, and reactive to light.  Neck: Normal range of motion. Neck supple. No  thyromegaly present.  Cardiovascular: Normal rate, regular rhythm and normal heart sounds.   No murmur heard. Pulmonary/Chest: Breath sounds normal. No respiratory distress. She has no wheezes. She has no rales.  Abdominal: Soft. Bowel sounds are normal. She exhibits no distension and no mass. There is no tenderness. There is no rebound and no guarding.  Genitourinary:  Breasts are symmetric with no mass noted. No nipple discharge or dimpling Pelvic exam normal external genitalia. Cervix is normal in appearance. No vaginal discharge. Pap smear obtained. Bimanual no adnexal mass or tenderness  Musculoskeletal: Normal range of motion. She exhibits no edema.  Lymphadenopathy:    She has no cervical adenopathy.  Neurological: She is alert and oriented to person, place, and time. She displays normal reflexes. No cranial nerve deficit.  Skin: No rash noted.  Psychiatric: She has a normal mood and affect. Her behavior is normal. Judgment and thought content normal.          Assessment & Plan:  Complete physical. Schedule mammogram. Pap smear obtained. Work on [employer]weight loss-strategies discussed. Establish more consistent exercise. Start iron supplement 325 mg once daily. Hemoccult cards given. Recheck CBC 1 month.

## 2013-10-09 NOTE — Progress Notes (Signed)
Pre visit review using our clinic review tool, if applicable. No additional management support is needed unless otherwise documented below in the visit note. 

## 2013-10-10 ENCOUNTER — Other Ambulatory Visit: Payer: Self-pay

## 2013-10-10 DIAGNOSIS — Z1231 Encounter for screening mammogram for malignant neoplasm of breast: Secondary | ICD-10-CM

## 2013-10-11 ENCOUNTER — Ambulatory Visit (INDEPENDENT_AMBULATORY_CARE_PROVIDER_SITE_OTHER): Payer: BC Managed Care – PPO | Admitting: Family Medicine

## 2013-10-11 ENCOUNTER — Encounter: Payer: Self-pay | Admitting: Family Medicine

## 2013-10-11 VITALS — BP 152/92 | HR 109 | Ht 60.0 in | Wt 221.0 lb

## 2013-10-11 DIAGNOSIS — S8290XD Unspecified fracture of unspecified lower leg, subsequent encounter for closed fracture with routine healing: Secondary | ICD-10-CM

## 2013-10-11 DIAGNOSIS — S8262XD Displaced fracture of lateral malleolus of left fibula, subsequent encounter for closed fracture with routine healing: Secondary | ICD-10-CM

## 2013-10-11 LAB — CYTOLOGY - PAP

## 2013-10-11 NOTE — Assessment & Plan Note (Signed)
Encouraged patient continue to wear the brace when she is doing significant amount of activity. We discussed icing protocol and to do this more on a regular basis. Patient did home exercises 3-5 times a week. Give her more strengthening exercises as well and can formation and showed her some technique improvements. Patient will try these changes and come back again one more time in 4 weeks.  Spent greater than 25 minutes with patient face-to-face and had greater than 50% of counseling including as described above in assessment and plan.

## 2013-10-11 NOTE — Progress Notes (Signed)
Tawana ScaleZach Smith D.O. [facility] [address] [place], [place] [address] Phone: [phone] Subjective:     CC:  Left ankle fracture followup IRJ:JOACZYSAYTHPI:Subjective Danielle FeelingOyana M Novak is a 43 y.o. female coming in with complaint of left ankle pain. Patient was seen previously and was diagnosed with an avulsion fracture the lateral mild did have some delayed healing because with 4 weeks. Patient was put in a lateral splint for the ankle for activity. We discussed icing protocol as well as over-the-counter medications. Patient was to start home exercises. Patient states overall she has been doing significantly better. Patient was able to work out today at the gym without any significant discomfort. Patient still states that most the pain to be a medial aspect near the deltoid ligament. Denies any new symptoms denies any worsening symptoms.   .   Review of Systems: No headache, visual changes, nausea, vomiting, diarrhea, constipation, dizziness, abdominal pain, skin rash, fevers, chills, night sweats, weight loss, swollen lymph nodes, body aches, joint swelling, muscle aches, chest pain, shortness of breath, mood changes.   Objective Blood pressure 152/92, pulse 109, height 5' (1.524 m), weight 221 lb (100.245 kg), last menstrual period 08/30/2013, SpO2 98.00%.  General: No apparent distress alert and oriented x3 mood and affect normal, dressed appropriately.  HEENT: Pupils equal, extraocular movements intact  Respiratory: Patient's speak in full sentences and does not appear short of breath  Cardiovascular: No lower extremity edema, non tender, no erythema  Skin: Warm dry intact with no signs of infection or rash on extremities or on axial skeleton.  Abdomen: Soft nontender  Neuro: Cranial nerves II through XII are intact, neurovascularly intact in all extremities with 2+ DTRs and 2+ pulses.  Lymph: No lymphadenopathy of posterior or anterior cervical chain or axillae bilaterally.  Gait normal  with good balance and coordination.  MSK:  Non tender with full range of motion and good stability and symmetric strength and tone of shoulders, elbows, wrist, hip, knees bilaterally.  Ankle: Left No effusion noted Range of motion is full in all directions. Strength is 5/5 in all directions. Stable lateral and medial ligaments; squeeze test and kleiger test unremarkable; Talar dome nontender; No pain at base of 5th MT; No tenderness over cuboid; No tenderness over N spot or navicular prominence Significantly decreased tenderness on exam over the medial or lateral malleolus. No sign of peroneal tendon subluxations or tenderness to palpation Negative tarsal tunnel tinel's Able to walk 4 steps.  MSK US performed of: Left ankle This study was ordered, performed, and interpreted by Terrilee FilesZach Smith D.O.  Foot/Ankle:   All structures visualized.   Talar dome unremarkable the patient does have a trace effusion of the ankle to Peroneus longus and brevis tendons unremarkable on long and transverse views without sheath effusions. Posterior tibialis, flexor hallucis longus, and flexor digitorum longus tendons unremarkable on long and transverse views without sheath effusions. Patient soft tissue edema has resolved. Achilles tendon visualized along length of tendon and unremarkable on long and transverse views without sheath effusion. Anterior Talofibular Ligament  intact. Hard callus formation or lateral malleolus noted with no displacement.   Calcaneofibular Ligaments unremarkable and intact. Deltoid Ligament unremarkable and intact. Plantar fascia intact and without effusion, normal thickness. No increased doppler signal, cap sign, or thickening of tibial cortex. Power doppler signal normal.  IMPRESSION: Healed avulsion fracture.     Impression and Recommendations:     This case required medical decision making of moderate complexity.

## 2013-10-11 NOTE — Patient Instructions (Signed)
It is good to see you.  I would continue to wear the brace with exercise for the next 3 weeks.  Ice bath at end of long day would be helpful.  My exercises 3-5 times a week.  See me again in 4 weeks if not perfect.

## 2013-10-16 ENCOUNTER — Ambulatory Visit
Admission: RE | Admit: 2013-10-16 | Discharge: 2013-10-16 | Disposition: A | Discharge status: Home / Self Care | Payer: BC Managed Care – PPO | Source: Ambulatory Visit

## 2013-10-16 DIAGNOSIS — Z1231 Encounter for screening mammogram for malignant neoplasm of breast: Secondary | ICD-10-CM

## 2013-11-08 ENCOUNTER — Other Ambulatory Visit: Payer: BC Managed Care – PPO

## 2013-11-15 ENCOUNTER — Other Ambulatory Visit (INDEPENDENT_AMBULATORY_CARE_PROVIDER_SITE_OTHER): Payer: BC Managed Care – PPO

## 2013-11-15 DIAGNOSIS — D508 Other iron deficiency anemias: Secondary | ICD-10-CM

## 2013-11-15 LAB — CBC WITH DIFFERENTIAL/PLATELET
BASOS ABS: 0 10*3/uL (ref 0.0–0.1)
Basophils Relative: 0.3 % (ref 0.0–3.0)
EOS ABS: 0 10*3/uL (ref 0.0–0.7)
Eosinophils Relative: 0.8 % (ref 0.0–5.0)
HCT: 37.7 % (ref 36.0–46.0)
HEMOGLOBIN: 11.9 g/dL — AB (ref 12.0–15.0)
LYMPHS PCT: 41.2 % (ref 12.0–46.0)
Lymphs Abs: 2.1 10*3/uL (ref 0.7–4.0)
MCHC: 31.5 g/dL (ref 30.0–36.0)
MCV: 72.8 fl — ABNORMAL LOW (ref 78.0–100.0)
MONO ABS: 0.4 10*3/uL (ref 0.1–1.0)
Monocytes Relative: 7.1 % (ref 3.0–12.0)
NEUTROS ABS: 2.6 10*3/uL (ref 1.4–7.7)
NEUTROS PCT: 50.6 % (ref 43.0–77.0)
Platelets: 282 10*3/uL (ref 150.0–400.0)
RBC: 5.17 Mil/uL — ABNORMAL HIGH (ref 3.87–5.11)
RDW: 17.7 % — AB (ref 11.5–15.5)
WBC: 5.1 10*3/uL (ref 4.0–10.5)

## 2014-09-17 ENCOUNTER — Encounter (HOSPITAL_COMMUNITY): Payer: Self-pay | Admitting: Family Medicine

## 2014-09-17 ENCOUNTER — Emergency Department (HOSPITAL_COMMUNITY)
Admission: EM | Admit: 2014-09-17 | Discharge: 2014-09-17 | Disposition: A | Discharge status: Home / Self Care | Payer: Self-pay | Attending: Emergency Medicine | Admitting: Emergency Medicine

## 2014-09-17 DIAGNOSIS — I10 Essential (primary) hypertension: Secondary | ICD-10-CM | POA: Insufficient documentation

## 2014-09-17 DIAGNOSIS — R51 Headache: Secondary | ICD-10-CM

## 2014-09-17 DIAGNOSIS — Z79899 Other long term (current) drug therapy: Secondary | ICD-10-CM | POA: Insufficient documentation

## 2014-09-17 DIAGNOSIS — Z8619 Personal history of other infectious and parasitic diseases: Secondary | ICD-10-CM | POA: Insufficient documentation

## 2014-09-17 DIAGNOSIS — R519 Headache, unspecified: Secondary | ICD-10-CM

## 2014-09-17 HISTORY — DX: Essential (primary) hypertension: I10

## 2014-09-17 MED ORDER — AMLODIPINE BESYLATE 5 MG PO TABS
10.0000 mg | ORAL_TABLET | Freq: Once | ORAL | Status: AC
Start: 2014-09-17 — End: 2014-09-17
  Administered 2014-09-17: 10 mg via ORAL
  Filled 2014-09-17: qty 2

## 2014-09-17 MED ORDER — METOCLOPRAMIDE HCL 10 MG PO TABS: 10.0000 mg | ORAL_TABLET | Freq: Four times a day (QID) | ORAL | Status: DC

## 2014-09-17 MED ORDER — METOCLOPRAMIDE HCL 5 MG/ML IJ SOLN
10.0000 mg | Freq: Once | INTRAMUSCULAR | Status: AC
  Administered 2014-09-17: 10 mg via INTRAMUSCULAR
  Filled 2014-09-17: qty 2

## 2014-09-17 MED ORDER — KETOROLAC TROMETHAMINE 60 MG/2ML IM SOLN
60.0000 mg | Freq: Once | INTRAMUSCULAR | Status: AC
  Administered 2014-09-17: 60 mg via INTRAMUSCULAR
  Filled 2014-09-17: qty 2

## 2014-09-17 NOTE — ED Notes (Signed)
Pt here for severe HA that started yesterday with hypertension. sts she hasnt taken her HTN meds in over 6 months.

## 2014-09-17 NOTE — Discharge Instructions (Signed)
Return to the emergency room with worsening of symptoms, new symptoms or with symptoms that are concerning , especially chest pain, shortness of breath, severe worsening of headache, visual or speech changes, weakness in face, arms or legs. Follow up with neurology for headaches Please call your doctor for a followup appointment within 24-48 hours. When you talk to your doctor please let them know that you were seen in the emergency department and have them acquire all of your records so that they can discuss the findings with you and formulate a treatment plan to fully care for your new and ongoing problems. Read below information and follow recommendations. Hypertension Hypertension, commonly called high blood pressure, is when the force of blood pumping through your arteries is too strong. Your arteries are the blood vessels that carry blood from your heart throughout your body. A blood pressure reading consists of a higher number over a lower number, such as 110/72. The higher number (systolic) is the pressure inside your arteries when your heart pumps. The lower number (diastolic) is the pressure inside your arteries when your heart relaxes. Ideally you want your blood pressure below 120/80. Hypertension forces your heart to work harder to pump blood. Your arteries may become narrow or stiff. Having hypertension puts you at risk for heart disease, stroke, and other problems.  RISK FACTORS Some risk factors for high blood pressure are controllable. Others are not.  Risk factors you cannot control include:   Race. You may be at higher risk if you are African American.  Age. Risk increases with age.  Gender. Men are at higher risk than women before age 54 years. After age 44, women are at higher risk than men. Risk factors you can control include:  Not getting enough exercise or physical activity.  Being overweight.  Getting too much fat, sugar, calories, or salt in your diet.  Drinking too  much alcohol. SIGNS AND SYMPTOMS Hypertension does not usually cause signs or symptoms. Extremely high blood pressure (hypertensive crisis) may cause headache, anxiety, shortness of breath, and nosebleed. DIAGNOSIS  To check if you have hypertension, your health care provider will measure your blood pressure while you are seated, with your arm held at the level of your heart. It should be measured at least twice using the same arm. Certain conditions can cause a difference in blood pressure between your right and left arms. A blood pressure reading that is higher than normal on one occasion does not mean that you need treatment. If one blood pressure reading is high, ask your health care provider about having it checked again. TREATMENT  Treating high blood pressure includes making lifestyle changes and possibly taking medicine. Living a healthy lifestyle can help lower high blood pressure. You may need to change some of your habits. Lifestyle changes may include:  Following the DASH diet. This diet is high in fruits, vegetables, and whole grains. It is low in salt, red meat, and added sugars.  Getting at least 2 hours of brisk physical activity every week.  Losing weight if necessary.  Not smoking.  Limiting alcoholic beverages.  Learning ways to reduce stress. If lifestyle changes are not enough to get your blood pressure under control, your health care provider may prescribe medicine. You may need to take more than one. Work closely with your health care provider to understand the risks and benefits. HOME CARE INSTRUCTIONS  Have your blood pressure rechecked as directed by your health care provider.   Take medicines only  as directed by your health care provider. Follow the directions carefully. Blood pressure medicines must be taken as prescribed. The medicine does not work as well when you skip doses. Skipping doses also puts you at risk for problems.   Do not smoke.   Monitor  your blood pressure at home as directed by your health care provider. SEEK MEDICAL CARE IF:   You think you are having a reaction to medicines taken.  You have recurrent headaches or feel dizzy.  You have swelling in your ankles.  You have trouble with your vision. SEEK IMMEDIATE MEDICAL CARE IF:  You develop a severe headache or confusion.  You have unusual weakness, numbness, or feel faint.  You have severe chest or abdominal pain.  You vomit repeatedly.  You have trouble breathing. MAKE SURE YOU:   Understand these instructions.  Will watch your condition.  Will get help right away if you are not doing well or get worse. Document Released: 03/30/2005 Document Revised: 08/14/2013 Document Reviewed: 01/20/2013 [facility] Patient Information 2015 [place], [place]. This information is not intended to replace advice given to you by your health care provider. Make sure you discuss any questions you have with your health care provider.

## 2014-09-17 NOTE — ED Provider Notes (Signed)
CSN: [id number]     Arrival date & time 09/17/14  6578 History   First MD Initiated Contact with Patient 09/17/14 [phone]     Chief Complaint  Patient presents with  . Headache  . Hypertension     (Consider location/radiation/quality/duration/timing/severity/associated sxs/prior Treatment) HPI  Danielle Novak is a 44 y.o. female with PMH of HTN presenting with HA started yesterday night that developed gradually and like other headaches she has had before. Exception is new nausea, photophobia and sensitivity to smell. Headache diffuse and described as pressure. Significant improvement with excedrine and reports only mild headache today, 3/10. No visual changes, slurred speech, weakness, fevers, chills, head injury, lightheadedness or syncope. No neck pain or trauma. Patient also concerned with blood pressure. Patient states she had not hypertrophy of her medications for 6 months due to loss of insurance. She started taking her blood pressure medications yesterday and took 1 dose today. She denies any chest pain, shortness of breath, hematuria.   Past Medical History  Diagnosis Date  . Chicken pox     age 21  . Hypertension    History reviewed. No pertinent past surgical history. Family History  Problem Relation Age of Onset  . Hypertension Mother   . Diabetes Maternal Grandmother    History  Substance Use Topics  . Smoking status: Never Smoker   . Smokeless tobacco: Not on file  . Alcohol Use: No   OB History    No data available     Review of Systems 10 Systems reviewed and are negative for acute change except as noted in the HPI.    Allergies  Review of patient's allergies indicates no known allergies.  Home Medications   Prior to Admission medications   Medication Sig Start Date End Date Taking? Authorizing Provider  amLODipine (NORVASC) 5 MG tablet Take 1 tablet (5 mg total) by mouth daily. 10/09/13  Yes Kristian Covey, MD  diphenhydrAMINE (BENADRYL) 12.5 MG/5ML  liquid Take 12.5 mg by mouth 4 (four) times daily as needed.   Yes Historical Provider, MD  fexofenadine-pseudoephedrine (ALLEGRA-D 24) 180-240 MG per 24 hr tablet Take 1 tablet by mouth daily.   Yes Historical Provider, MD  metoCLOPramide (REGLAN) 10 MG tablet Take 1 tablet (10 mg total) by mouth every 6 (six) hours. 09/17/14   Oswaldo Conroy, PA-C   BP 153/92 mmHg  Pulse 99  Temp(Src) 98.3 F (36.8 C) (Oral)  Resp 18  Ht 5' (1.524 m)  Wt 225 lb (102.059 kg)  BMI 43.94 kg/m2  SpO2 99%  LMP 07/18/2014 Physical Exam  Constitutional: She appears well-developed and well-nourished. No distress.  HENT:  Head: Normocephalic and atraumatic.  Mouth/Throat: Oropharynx is clear and moist.  Eyes: Conjunctivae and EOM are normal. Pupils are equal, round, and reactive to light. Right eye exhibits no discharge. Left eye exhibits no discharge.  Neck: Normal range of motion. Neck supple.  No nuchal rigidity  Cardiovascular: Normal rate and regular rhythm.   Pulmonary/Chest: Effort normal and breath sounds normal. No respiratory distress. She has no wheezes.  Abdominal: Soft. Bowel sounds are normal. She exhibits no distension. There is no tenderness.  Neurological: She is alert. No cranial nerve deficit. Coordination normal.  Speech is clear and goal oriented. Strength 5/5 in upper and lower extremities. Sensation intact. Intact rapid alternating movements, finger to nose, and heel to shin. Negative Romberg. No pronator drift. Normal gait.  Skin: Skin is warm and dry. She is not diaphoretic.  Nursing note and  vitals reviewed.   ED Course  Procedures (including critical care time) Labs Review Labs Reviewed - No data to display  Imaging Review No results found.   EKG Interpretation None      MDM   Final diagnoses:  Nonintractable headache  Essential hypertension   Pt presenting with headache with associated nausea and photophobia. Headache not worse of life, maximal at onset or in  intensity. VSS. Neurological exam without deficits. I doubt ICH, SAH, meningitis. Pt states pain is mild an refusing IV medications and requesting IM/oral.  Pt also complaining of HTN. No visual changes, chest pain, SOB or hematuria. No evidence of HTN urgency. Will treat with additional dose of pt's home pain medication. Pt states she has 90 month supply. She has been urged to follow up with PCP closely for titration of new BP medication.  Discussed return precautions with patient. Discussed all results and patient verbalizes understanding and agrees with plan.  Case has been discussed with Dr. Madilyn Hookees who agrees with the above plan and to discharge.   Filed Vitals:   09/17/14 0749 09/17/14 0827 09/17/14 0842 09/17/14 0915  BP: 189/105  177/106 153/92  Pulse: 99   99  Temp: 98.3 F (36.8 C)     TempSrc: Oral     Resp: 18     Height:  5' (1.524 m)    Weight:  225 lb (102.059 kg)    SpO2: 98%   99%     Oswaldo ConroyVictoria Neziah Vogelgesang, PA-C 09/17/14 0955  Tilden FossaElizabeth Rees, MD 09/17/14 1136

## 2014-09-17 NOTE — ED Notes (Signed)
Pt given a cup of ice with a ginger ale to drink

## 2014-11-28 ENCOUNTER — Emergency Department (HOSPITAL_BASED_OUTPATIENT_CLINIC_OR_DEPARTMENT_OTHER): Payer: Self-pay

## 2014-11-28 ENCOUNTER — Encounter (HOSPITAL_BASED_OUTPATIENT_CLINIC_OR_DEPARTMENT_OTHER): Payer: Self-pay | Admitting: Emergency Medicine

## 2014-11-28 ENCOUNTER — Emergency Department (HOSPITAL_BASED_OUTPATIENT_CLINIC_OR_DEPARTMENT_OTHER)
Admission: EM | Admit: 2014-11-28 | Discharge: 2014-11-28 | Disposition: A | Discharge status: Home / Self Care | Payer: Self-pay | Attending: Emergency Medicine | Admitting: Emergency Medicine

## 2014-11-28 DIAGNOSIS — I10 Essential (primary) hypertension: Secondary | ICD-10-CM | POA: Insufficient documentation

## 2014-11-28 DIAGNOSIS — Z79899 Other long term (current) drug therapy: Secondary | ICD-10-CM | POA: Insufficient documentation

## 2014-11-28 DIAGNOSIS — Z3202 Encounter for pregnancy test, result negative: Secondary | ICD-10-CM | POA: Insufficient documentation

## 2014-11-28 DIAGNOSIS — N938 Other specified abnormal uterine and vaginal bleeding: Secondary | ICD-10-CM | POA: Insufficient documentation

## 2014-11-28 DIAGNOSIS — Z8619 Personal history of other infectious and parasitic diseases: Secondary | ICD-10-CM | POA: Insufficient documentation

## 2014-11-28 LAB — PREGNANCY, URINE: PREG TEST UR: NEGATIVE

## 2014-11-28 MED ORDER — NAPROXEN 500 MG PO TABS: 500.0000 mg | ORAL_TABLET | Freq: Two times a day (BID) | ORAL | Status: DC | PRN

## 2014-11-28 NOTE — ED Provider Notes (Signed)
CSN: [id number]     Arrival date & time 11/28/14  0905 History   First MD Initiated Contact with Patient 11/28/14 [phone]     Chief Complaint  Patient presents with  . Vaginal Bleeding     HPI  Patient presents for evaluation of some abdominal cramping and vaginal bleeding. States she was "normal" with her menstrual cycles until May of this year. "Normal" for her was 5 or 6 days of fairly heavy bleeding without bleeding in between cycles. Some cramping but not severe pain. She did not have a period through the month of June. In July she states she had 10 days of bleeding and then stop for 10 days. Restarted on the 27th and had bleeding until 4 days ago. She's had 3 days of no symptoms. She had some mild cramping this morning "like my period" she states that she has only a trace amount of blood and spotting now.  He does not have any other abnormal discharge. She is at no change in sexual partners. She denies symptoms of pregnancy. She does not currently use birth control and is not been on hormones or birth control or IUD for the last 2 years.  Past Medical History  Diagnosis Date  . Chicken pox     age 84  . Hypertension    History reviewed. No pertinent past surgical history. Family History  Problem Relation Age of Onset  . Hypertension Mother   . Diabetes Maternal Grandmother    Social History  Substance Use Topics  . Smoking status: Never Smoker   . Smokeless tobacco: Never Used  . Alcohol Use: No   OB History    No data available     Review of Systems  Constitutional: Negative for fever, chills, diaphoresis, appetite change and fatigue.  HENT: Negative for mouth sores, sore throat and trouble swallowing.   Eyes: Negative for visual disturbance.  Respiratory: Negative for cough, chest tightness, shortness of breath and wheezing.   Cardiovascular: Negative for chest pain.  Gastrointestinal: Negative for nausea, vomiting, abdominal pain, diarrhea and abdominal distention.   Endocrine: Negative for polydipsia, polyphagia and polyuria.  Genitourinary: Positive for vaginal bleeding and menstrual problem. Negative for dysuria, frequency and hematuria.  Musculoskeletal: Negative for gait problem.  Skin: Negative for color change, pallor and rash.  Neurological: Negative for dizziness, syncope, light-headedness and headaches.  Hematological: Does not bruise/bleed easily.  Psychiatric/Behavioral: Negative for behavioral problems and confusion.      Allergies  Review of patient's allergies indicates no known allergies.  Home Medications   Prior to Admission medications   Medication Sig Start Date End Date Taking? Authorizing Provider  amLODipine (NORVASC) 5 MG tablet Take 1 tablet (5 mg total) by mouth daily. 10/09/13  Yes Kristian Covey, MD  diphenhydrAMINE (BENADRYL) 12.5 MG/5ML liquid Take 12.5 mg by mouth 4 (four) times daily as needed.    Historical Provider, MD  fexofenadine-pseudoephedrine (ALLEGRA-D 24) 180-240 MG per 24 hr tablet Take 1 tablet by mouth daily.    Historical Provider, MD  metoCLOPramide (REGLAN) 10 MG tablet Take 1 tablet (10 mg total) by mouth every 6 (six) hours. 09/17/14   Oswaldo Conroy, PA-C  naproxen (NAPROSYN) 500 MG tablet Take 1 tablet (500 mg total) by mouth 2 (two) times daily as needed (menstrual pain). 11/28/14   Rolland Porter, MD   BP 182/103 mmHg  Pulse 83  Temp(Src) 97.6 F (36.4 C) (Oral)  Resp 18  Ht 5' (1.524 m)  Wt 220 lb (  99.791 kg)  BMI 42.97 kg/m2  SpO2 100%  LMP 11/07/2014 Physical Exam  Constitutional: She is oriented to person, place, and time. She appears well-developed and well-nourished. No distress.  HENT:  Head: Normocephalic.  Eyes: Conjunctivae are normal. Pupils are equal, round, and reactive to light. No scleral icterus.  Neck: Normal range of motion. Neck supple. No thyromegaly present.  Cardiovascular: Normal rate and regular rhythm.  Exam reveals no gallop and no friction rub.   No murmur  heard. Pulmonary/Chest: Effort normal and breath sounds normal. No respiratory distress. She has no wheezes. She has no rales.  Abdominal: Soft. Bowel sounds are normal. She exhibits no distension. There is no tenderness. There is no rebound.  Musculoskeletal: Normal range of motion.  Neurological: She is alert and oriented to person, place, and time.  Skin: Skin is warm and dry. No rash noted.  Psychiatric: She has a normal mood and affect. Her behavior is normal.    ED Course  Procedures (including critical care time) Labs Review Labs Reviewed  PREGNANCY, URINE    Imaging Review US Transvaginal Non-ob  11/28/2014   CLINICAL DATA:  Vaginal spotting.  Dysfunctional uterine bleeding.  EXAM: TRANSABDOMINAL AND TRANSVAGINAL ULTRASOUND OF PELVIS  TECHNIQUE: Both transabdominal and transvaginal ultrasound examinations of the pelvis were performed. Transabdominal technique was performed for global imaging of the pelvis including uterus, ovaries, adnexal regions, and pelvic cul-de-sac. It was necessary to proceed with endovaginal exam following the transabdominal exam to visualize the uterus, endometrium, ovaries and adnexa .  COMPARISON:  None  FINDINGS: Uterus  Measurements: 10.2 x 5.1 x 5.2 cm. No fibroids or other mass visualized.  Endometrium  Thickness: 13 mm in thickness.  No focal abnormality visualized.  Right ovary  Measurements: 3.3 x 2.2 x 2.2 cm. Normal appearance/no adnexal mass.  Left ovary  Measurements: 5.5 x 3.9 x 4.4 cm. Two simple cystic areas within the left ovary, measuring 3.9 cm and 2.0 cm in greatest diameter. No internal blood flow or nodularity. No septations.  Other findings  Trace free fluid in the pelvis.  IMPRESSION: Two simple appearing left ovarian cysts, the largest 3.9 cm. This is almost certainly benign, and no specific imaging follow up is recommended according to the Society of Radiologists in Ultrasound2010 Consensus Conference Statement (D Lenis Noon et al. Management  of Asymptomatic Ovarian and Other Adnexal Cysts Imaged at [place]: Society of Radiologists in Ultrasound Consensus Conference Statement 2010. Radiology 256 (Sept 2010): 943-954.).  Endometrium within normal limits.   Electronically Signed   By: Charlett Nose M.D.   On: 11/28/2014 11:04   US Pelvis Complete  11/28/2014   CLINICAL DATA:  Vaginal spotting.  Dysfunctional uterine bleeding.  EXAM: TRANSABDOMINAL AND TRANSVAGINAL ULTRASOUND OF PELVIS  TECHNIQUE: Both transabdominal and transvaginal ultrasound examinations of the pelvis were performed. Transabdominal technique was performed for global imaging of the pelvis including uterus, ovaries, adnexal regions, and pelvic cul-de-sac. It was necessary to proceed with endovaginal exam following the transabdominal exam to visualize the uterus, endometrium, ovaries and adnexa .  COMPARISON:  None  FINDINGS: Uterus  Measurements: 10.2 x 5.1 x 5.2 cm. No fibroids or other mass visualized.  Endometrium  Thickness: 13 mm in thickness.  No focal abnormality visualized.  Right ovary  Measurements: 3.3 x 2.2 x 2.2 cm. Normal appearance/no adnexal mass.  Left ovary  Measurements: 5.5 x 3.9 x 4.4 cm. Two simple cystic areas within the left ovary, measuring 3.9 cm and 2.0 cm in greatest diameter. No  internal blood flow or nodularity. No septations.  Other findings  Trace free fluid in the pelvis.  IMPRESSION: Two simple appearing left ovarian cysts, the largest 3.9 cm. This is almost certainly benign, and no specific imaging follow up is recommended according to the Society of Radiologists in Ultrasound2010 Consensus Conference Statement (D Lenis Noon et al. Management of Asymptomatic Ovarian and Other Adnexal Cysts Imaged at [place]: Society of Radiologists in Ultrasound Consensus Conference Statement 2010. Radiology 256 (Sept 2010): 943-954.).  Endometrium within normal limits.   Electronically Signed   By: Charlett Nose M.D.   On: 11/28/2014 11:04   I have personally reviewed and evaluated  these images and lab results as part of my medical decision-making.   EKG Interpretation None      MDM   Final diagnoses:  Dysfunctional uterine bleeding    Reassuring ultrasound with only physiologic size cysts. No free fluid, no adnexal masses, no fibroids. Her symptoms are consistent with dysfunctional uterine bleeding without obvious cause. She has had no change in partner. She had a normal Pap smear 14 months ago. She states that she would "prefer" to go to her primary care doctor for "my Pap smear". We discussed possibility of STI. She has not had change in partner. She does not have postcoital bleeding. She does not have dyspareunia.  Plan is expectant management. She is not pregnant. Naproxen for any discomfort associated with her abnormal cycles and primary care in a GYN follow-up.    Rolland Porter, MD 11/28/14 [phone]

## 2014-11-28 NOTE — ED Notes (Signed)
Patient transported to Ultrasound and returned at this time 

## 2014-11-28 NOTE — ED Notes (Signed)
MD at bedside. 

## 2014-11-28 NOTE — ED Notes (Signed)
Pt reports last menstrual cycle July 27-August 13. C/o pink d/c and abd cramping since yesterday

## 2014-11-28 NOTE — Discharge Instructions (Signed)
Abnormal Uterine Bleeding Abnormal uterine bleeding means bleeding from the vagina that is not your normal menstrual period. This can be:  Bleeding or spotting between periods.  Bleeding after sex (sexual intercourse).  Bleeding that is heavier or more than normal.  Periods that last longer than usual.  Bleeding after menopause. There are many problems that may cause this. Treatment will depend on the cause of the bleeding. Any kind of bleeding that is not normal should be reviewed by your doctor.  HOME CARE Watch your condition for any changes. These actions may lessen any discomfort you are having:  Do not use tampons or douches as told by your doctor.  Change your pads often. You should get regular pelvic exams and Pap tests. Keep all appointments for tests as told by your doctor. GET HELP IF:  You are bleeding for more than 1 week.  You feel dizzy at times. GET HELP RIGHT AWAY IF:   You pass out.  You have to change pads every 15 to 30 minutes.  You have belly pain.  You have a fever.  You become sweaty or weak.  You are passing large blood clots from the vagina.  You feel sick to your stomach (nauseous) and throw up (vomit). MAKE SURE YOU:  Understand these instructions.  Will watch your condition.  Will get help right away if you are not doing well or get worse. Document Released: 01/25/2009 Document Revised: 04/04/2013 Document Reviewed: 10/27/2012 [facility] Patient Information 2015 [place], [place]. This information is not intended to replace advice given to you by your health care provider. Make sure you discuss any questions you have with your health care provider.  Dysfunctional Uterine Bleeding Normally, menstrual periods begin between ages 91 to 47 in young women. A normal menstrual cycle/period may begin every 23 days up to 35 days and lasts from 1 to 7 days. Around 12 to 14 days before your menstrual period starts, ovulation (ovary produces an egg)  occurs. When counting the time between menstrual periods, count from the first day of bleeding of the previous period to the first day of bleeding of the next period. Dysfunctional (abnormal) uterine bleeding is bleeding that is different from a normal menstrual period. Your periods may come earlier or later than usual. They may be lighter, have blood clots or be heavier. You may have bleeding between periods, or you may skip one period or more. You may have bleeding after sexual intercourse, bleeding after menopause, or no menstrual period. CAUSES   Pregnancy (normal, miscarriage, tubal).  IUDs (intrauterine device, birth control).  Birth control pills.  Hormone treatment.  Menopause.  Infection of the cervix.  Blood clotting problems.  Infection of the inside lining of the uterus.  Endometriosis, inside lining of the uterus growing in the pelvis and other female organs.  Adhesions (scar tissue) inside the uterus.  Obesity or severe weight loss.  Uterine polyps inside the uterus.  Cancer of the vagina, cervix, or uterus.  Ovarian cysts or polycystic ovary syndrome.  Medical problems (diabetes, thyroid disease).  Uterine fibroids (noncancerous tumor).  Problems with your female hormones.  Endometrial hyperplasia, very thick lining and enlarged cells inside of the uterus.  Medicines that interfere with ovulation.  Radiation to the pelvis or abdomen.  Chemotherapy. DIAGNOSIS   Your doctor will discuss the history of your menstrual periods, medicines you are taking, changes in your weight, stress in your life, and any medical problems you may have.  Your doctor will do a  physical and pelvic examination.  Your doctor may want to perform certain tests to make a diagnosis, such as:  Pap test.  Blood tests.  Cultures for infection.  CT scan.  Ultrasound.  Hysteroscopy.  Laparoscopy.  MRI.  Hysterosalpingography.  D and C.  Endometrial  biopsy. TREATMENT  Treatment will depend on the cause of the dysfunctional uterine bleeding (DUB). Treatment may include:  Observing your menstrual periods for a couple of months.  Prescribing medicines for medical problems, including:  Antibiotics.  Hormones.  Birth control pills.  Removing an IUD (intrauterine device, birth control).  Surgery:  D and C (scrape and remove tissue from inside the uterus).  Laparoscopy (examine inside the abdomen with a lighted tube).  Uterine ablation (destroy lining of the uterus with electrical current, laser, heat, or freezing).  Hysteroscopy (examine cervix and uterus with a lighted tube).  Hysterectomy (remove the uterus). HOME CARE INSTRUCTIONS   If medicines were prescribed, take exactly as directed. Do not change or switch medicines without consulting your caregiver.  Long term heavy bleeding may result in iron deficiency. Your caregiver may have prescribed iron pills. They help replace the iron that your body lost from heavy bleeding. Take exactly as directed.  Do not take aspirin or medicines that contain aspirin one week before or during your menstrual period. Aspirin may make the bleeding worse.  If you need to change your sanitary pad or tampon more than once every 2 hours, stay in bed with your feet elevated and a cold pack on your lower abdomen. Rest as much as possible, until the bleeding stops or slows down.  Eat well-balanced meals. Eat foods high in iron. Examples are:  Leafy green vegetables.  Whole-grain breads and cereals.  Eggs.  Meat.  Liver.  Do not try to lose weight until the abnormal bleeding has stopped and your blood iron level is back to normal. Do not lift more than ten pounds or do strenuous activities when you are bleeding.  For a couple of months, make note on your calendar, marking the start and ending of your period, and the type of bleeding (light, medium, heavy, spotting, clots or missed  periods). This is for your caregiver to better evaluate your problem. SEEK MEDICAL CARE IF:   You develop nausea (feeling sick to your stomach) and vomiting, dizziness, or diarrhea while you are taking your medicine.  You are getting lightheaded or weak.  You have any problems that may be related to the medicine you are taking.  You develop pain with your DUB.  You want to remove your IUD.  You want to stop or change your birth control pills or hormones.  You have any type of abnormal bleeding mentioned above.  You are over 54 years old and have not had a menstrual period yet.  You are 44 years old and you are still having menstrual periods.  You have any of the symptoms mentioned above.  You develop a rash. SEEK IMMEDIATE MEDICAL CARE IF:   An oral temperature above 102 F (38.9 C) develops.  You develop chills.  You are changing your sanitary pad or tampon more than once an hour.  You develop abdominal pain.  You pass out or faint. Document Released: 03/27/2000 Document Revised: 06/22/2011 Document Reviewed: 02/26/2009 [facility] Patient Information 2015 [place], [place]. This information is not intended to replace advice given to you by your health care provider. Make sure you discuss any questions you have with your health care provider.

## 2014-11-28 NOTE — ED Notes (Addendum)
Pt directed to pharmacy to pick RX. Advised to have b/p rechecked this week by PCP. Pt states she has not taken her b/p meds yet today

## 2015-03-25 ENCOUNTER — Other Ambulatory Visit (HOSPITAL_COMMUNITY)
Admission: RE | Admit: 2015-03-25 | Discharge: 2015-03-25 | Disposition: A | Discharge status: Home / Self Care | Payer: Self-pay | Source: Ambulatory Visit | Attending: Obstetrics and Gynecology | Admitting: Obstetrics and Gynecology

## 2015-03-25 ENCOUNTER — Ambulatory Visit (INDEPENDENT_AMBULATORY_CARE_PROVIDER_SITE_OTHER): Payer: Self-pay | Admitting: Obstetrics and Gynecology

## 2015-03-25 ENCOUNTER — Encounter: Payer: Self-pay | Admitting: Obstetrics and Gynecology

## 2015-03-25 VITALS — BP 168/93 | HR 84 | Ht 60.0 in | Wt 226.2 lb

## 2015-03-25 DIAGNOSIS — N938 Other specified abnormal uterine and vaginal bleeding: Secondary | ICD-10-CM | POA: Insufficient documentation

## 2015-03-25 DIAGNOSIS — Z01812 Encounter for preprocedural laboratory examination: Secondary | ICD-10-CM

## 2015-03-25 LAB — POCT PREGNANCY, URINE: Preg Test, Ur: NEGATIVE

## 2015-03-25 MED ORDER — MEGESTROL ACETATE 40 MG PO TABS: 40.0000 mg | ORAL_TABLET | Freq: Every day | ORAL | Status: DC

## 2015-03-25 NOTE — Progress Notes (Signed)
Patient ID: Danielle Novak, female   DOB: 1970/06/25, 44 y.o.   MRN: [id number] 44 yo G1P1 here for the evaluation of DUB. Patient reports a normal 6-day monthly cycle until June 2016 when she started to experience 14-day cycles twice monthly. Patient reports the flow as heavy using 30 pads per month. She denies pelvic pain. She denies passage of clots. She denies feeling dizzy or lightheaded.  Past Medical History  Diagnosis Date  . Chicken pox     age 44  . Hypertension    History reviewed. No pertinent past surgical history. Family History  Problem Relation Age of Onset  . Hypertension Mother   . Diabetes Maternal Grandmother    Social History  Substance Use Topics  . Smoking status: Never Smoker   . Smokeless tobacco: Never Used  . Alcohol Use: No   ROS See pertinent in HPI  Blood pressure 168/93, pulse 84, height 5' (1.524 m), weight 226 lb 3.2 oz (102.604 kg), last menstrual period 03/09/2015.  GENERAL: Well-developed, well-nourished female in no acute distress. Obese ABDOMEN: Soft, nontender, nondistended. PELVIC: Normal external female genitalia. Vagina is pink and rugated.  Normal discharge. Normal appearing cervix. Uterus is normal in size. No adnexal mass or tenderness. EXTREMITIES: No cyanosis, clubbing, or edema, 2+ distal pulses.    2016 ultrasound FINDINGS: Uterus  Measurements: 10.2 x 5.1 x 5.2 cm. No fibroids or other mass visualized.  Endometrium  Thickness: 13 mm in thickness. No focal abnormality visualized.  Right ovary  Measurements: 3.3 x 2.2 x 2.2 cm. Normal appearance/no adnexal mass.  Left ovary  Measurements: 5.5 x 3.9 x 4.4 cm. Two simple cystic areas within the left ovary, measuring 3.9 cm and 2.0 cm in greatest diameter. No internal blood flow or nodularity. No septations.  Other findings  Trace free fluid in the pelvis.  IMPRESSION: Two simple appearing left ovarian cysts, the largest 3.9 cm. This is almost certainly  benign, and no specific imaging follow up is recommended according to the Society of Radiologists in Ultrasound2010 Consensus Conference Statement (D Lenis Noon et al. Management of Asymptomatic Ovarian and Other Adnexal Cysts Imaged at [place]: Society of Radiologists in Ultrasound Consensus Conference Statement 2010. Radiology 256 (Sept 2010): 943-954.).  Endometrium within normal limits.   Electronically Signed  By: Charlett Nose M.D.  On: 11/28/2014 11:04   A/P 44 yo with DUB - Discussed the need for an endometrial biopsy ENDOMETRIAL BIOPSY     The indications for endometrial biopsy were reviewed.   Risks of the biopsy including cramping, bleeding, infection, uterine perforation, inadequate specimen and need for additional procedures  were discussed. The patient states she understands and agrees to undergo procedure today. Consent was signed. Time out was performed. Urine HCG was negative. A sterile speculum was placed in the patient's vagina and the cervix was prepped with Betadine. A single-toothed tenaculum was placed on the anterior lip of the cervix to stabilize it. The uterine cavity was sounded to a depth of 9 cm using the uterine sound. The 3 mm pipelle was introduced into the endometrial cavity without difficulty, 2 passes were made.  A  moderate amount of tissue was  sent to pathology. The instruments were removed from the patient's vagina. Minimal bleeding from the cervix was noted. The patient tolerated the procedure well.  Routine post-procedure instructions were given to the patient.  - Discussed medical management with depo-provera or Progesterone containing IUD. Patient is not very interested in these long term options. Discussed medical management  with Megace for now which she is willing to try. Patient is hoping that this is a temporary change and that things will revert to normal  - Patient will be contacted with results of the biopsy. - RTC for further management options  when ready

## 2015-03-28 ENCOUNTER — Telehealth: Payer: Self-pay | Admitting: General Practice

## 2015-03-28 NOTE — Telephone Encounter (Signed)
Per Dr Jolayne Pantheronstant, patient's endo bx was negative. Called patient, no answer- left message to call us back at the clinics for results.

## 2015-03-28 NOTE — Telephone Encounter (Signed)
Patient called into front office and I informed her of results. Patient verbalized understanding & asked about next steps. Asked patient if she decided what option she wanted to go with for her bleeding. Patient states the mirena. Asked patient if she had insurance & she states no. Told patient to come by the office with a proof of income so she can fill out the [employer] form. Patient verbalized understanding & had no questions

## 2015-04-17 IMAGING — CR DG ANKLE COMPLETE 3+V*L*
3 series · 3 of 3 positions shown · non-contrast
Comparison: None.

CLINICAL DATA: 43-year-old female ankle injury with pain and
swelling. Swelling greatest at the lateral aspect. Initial
encounter.

EXAM:
LEFT ANKLE COMPLETE - 3+ VIEW

[view not recorded (1 of 3)]
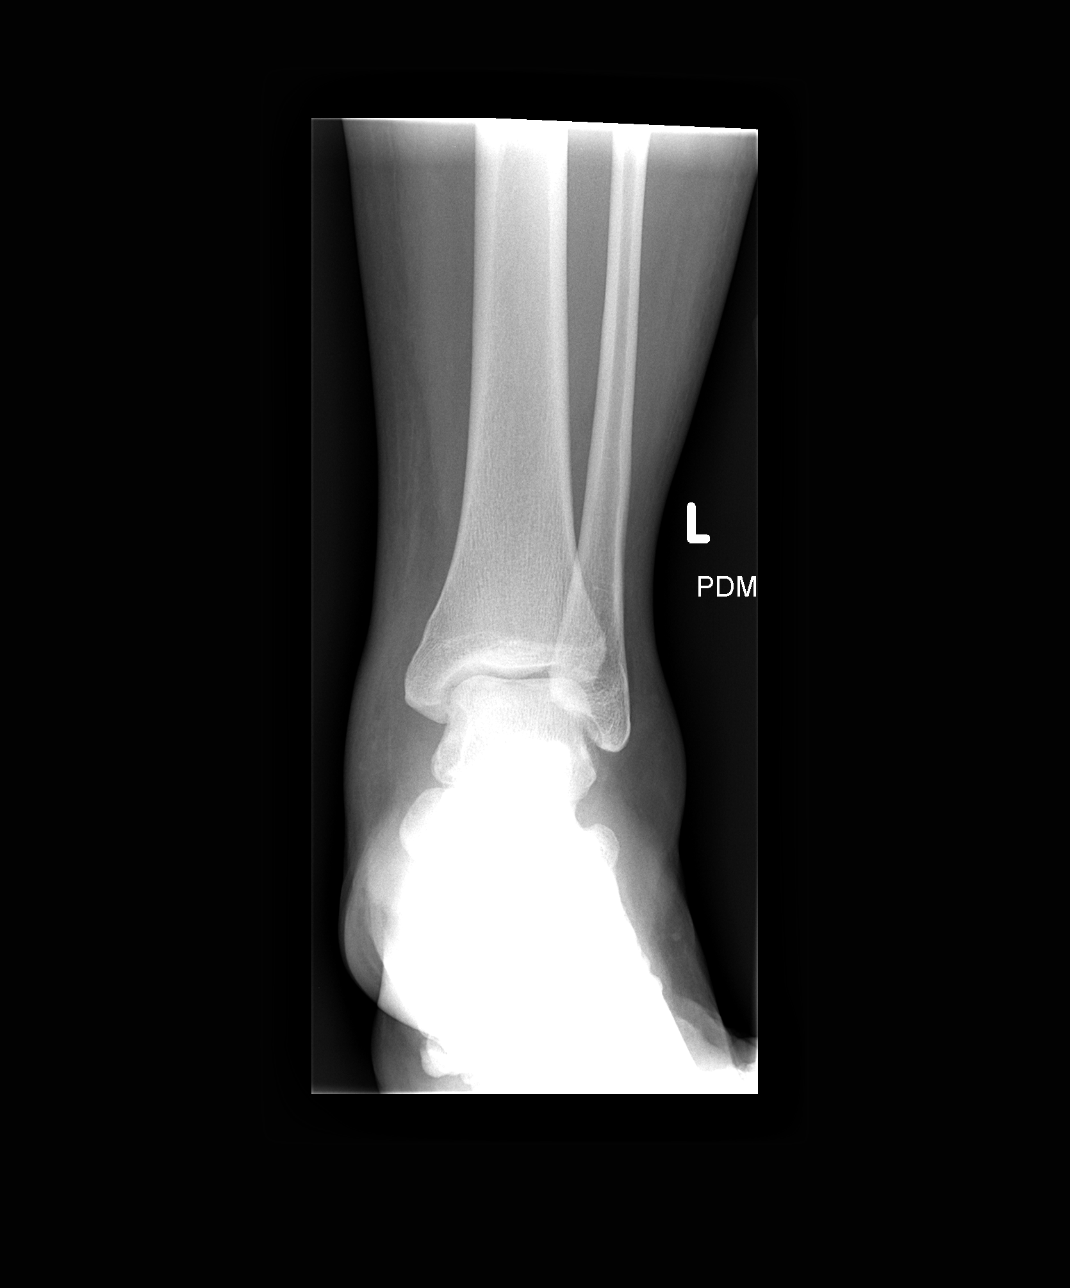

[view not recorded (2 of 3)]
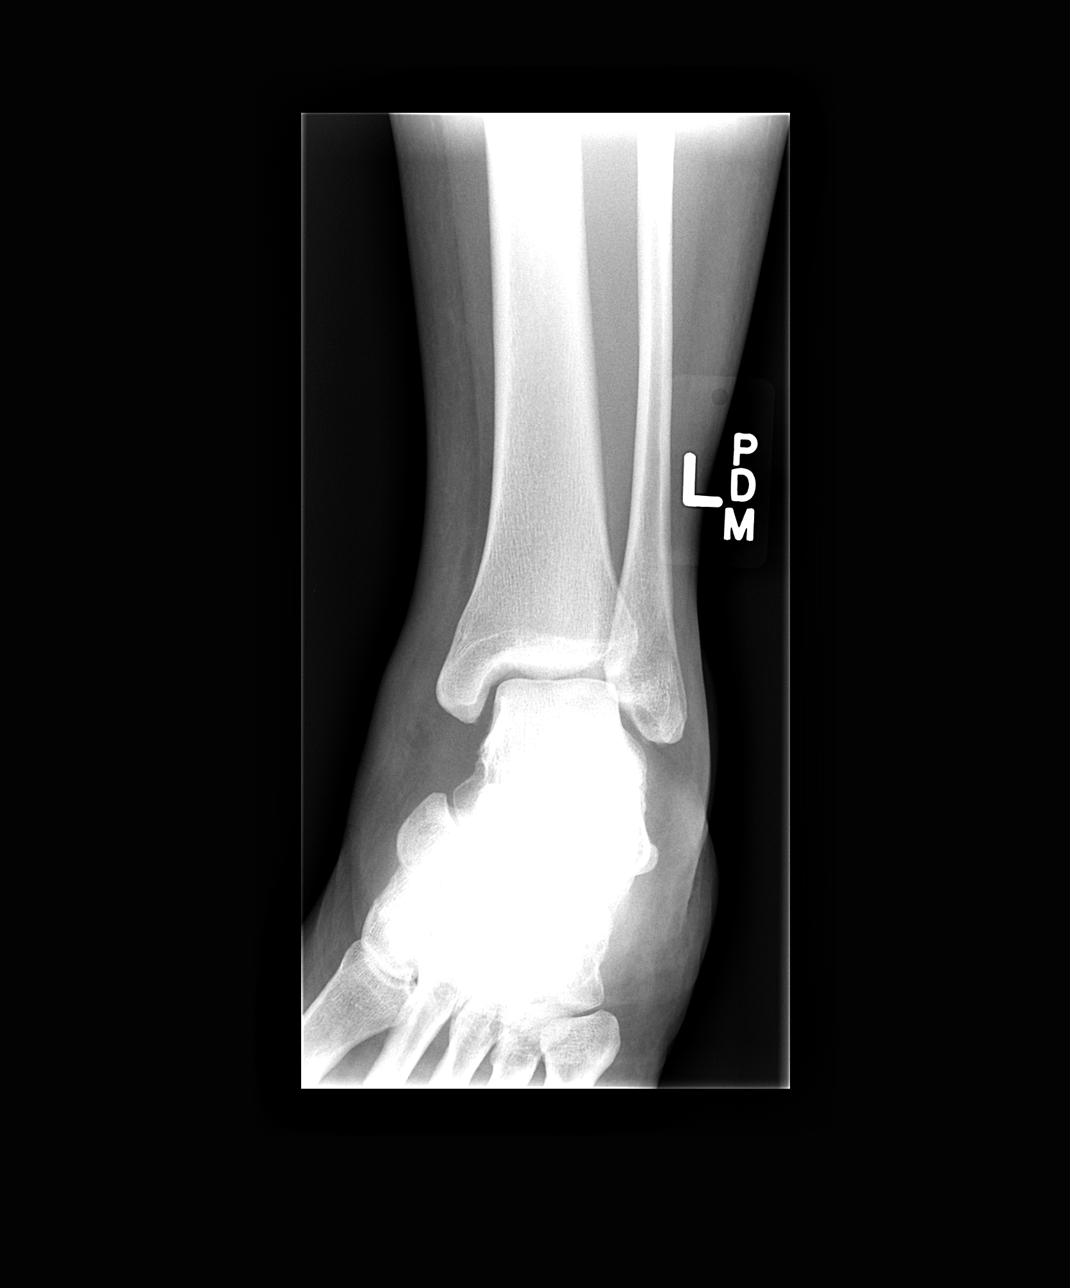

[view not recorded (3 of 3)]
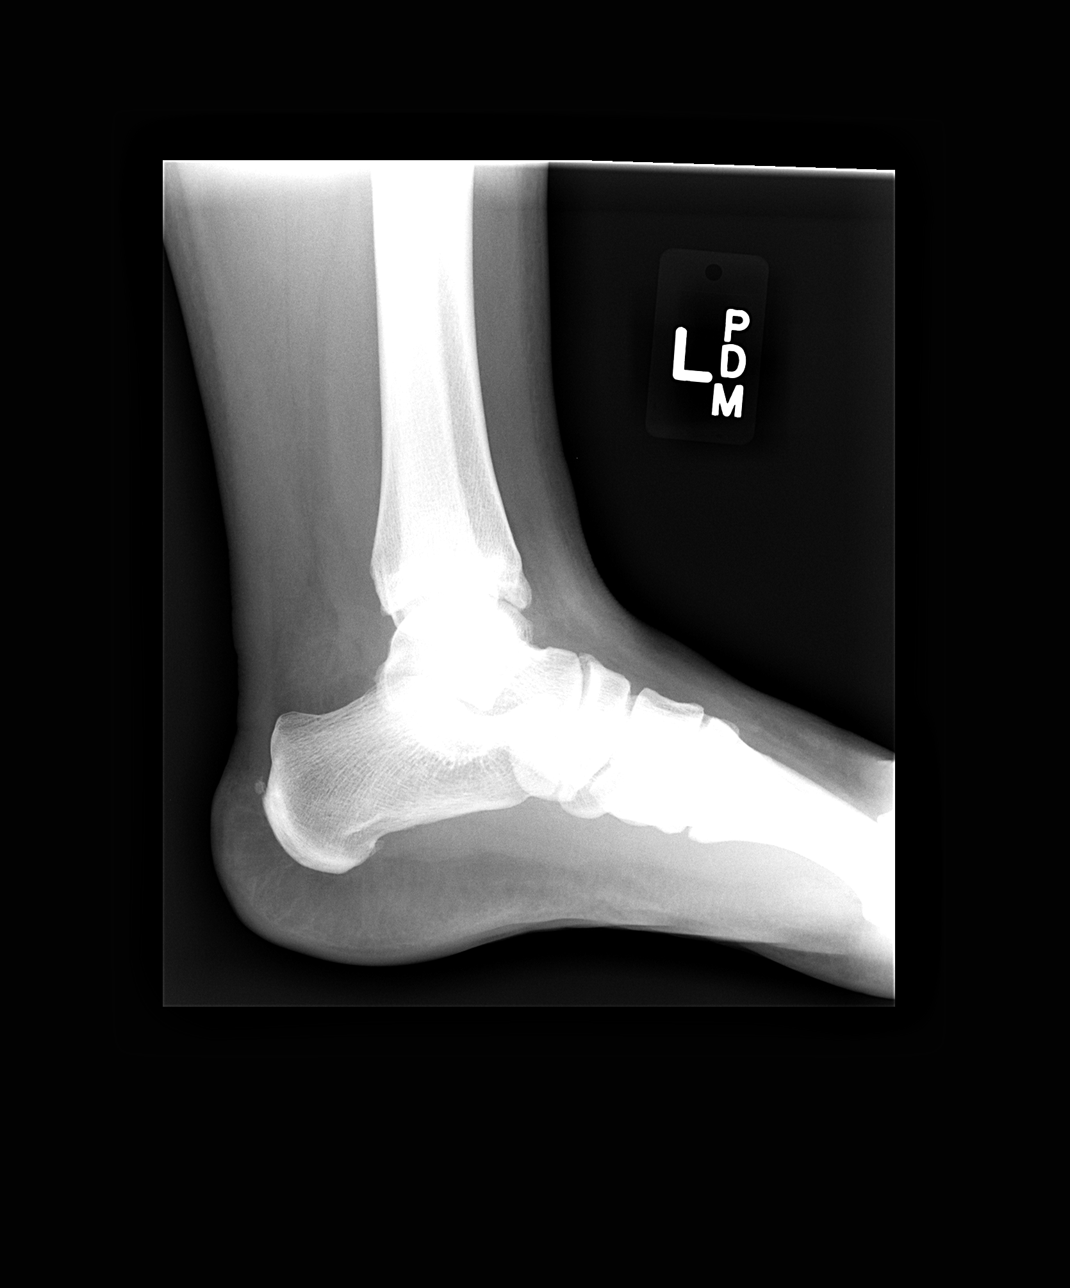

[3 of 3 positions shown; findings below may reference images not displayed]

FINDINGS: No definite joint effusion. Mortise joint alignment preserved. Talar
dome intact. Calcaneus intact with mild degenerative spurring. Soft
tissue swelling at the ankle. Subtle small avulsion fragment
suspected adjacent to the lateral malleolus (arrow on image 2). No
other fracture or dislocation identified.
IMPRESSION: Tiny avulsion fracture suspected at the lateral malleolus. No other
acute fracture or dislocation identified.

## 2015-06-12 ENCOUNTER — Encounter: Payer: Self-pay | Admitting: *Deleted

## 2015-07-04 ENCOUNTER — Other Ambulatory Visit (INDEPENDENT_AMBULATORY_CARE_PROVIDER_SITE_OTHER): Payer: BLUE CROSS/BLUE SHIELD

## 2015-07-04 DIAGNOSIS — Z0000 Encounter for general adult medical examination without abnormal findings: Secondary | ICD-10-CM

## 2015-07-04 DIAGNOSIS — R7989 Other specified abnormal findings of blood chemistry: Secondary | ICD-10-CM

## 2015-07-04 LAB — CBC WITH DIFFERENTIAL/PLATELET
BASOS ABS: 0 10*3/uL (ref 0.0–0.1)
BASOS PCT: 0.6 % (ref 0.0–3.0)
Eosinophils Absolute: 0.1 10*3/uL (ref 0.0–0.7)
Eosinophils Relative: 1.2 % (ref 0.0–5.0)
HEMATOCRIT: 37.4 % (ref 36.0–46.0)
HEMOGLOBIN: 11.9 g/dL — AB (ref 12.0–15.0)
LYMPHS PCT: 40.3 % (ref 12.0–46.0)
Lymphs Abs: 1.9 10*3/uL (ref 0.7–4.0)
MCHC: 31.9 g/dL (ref 30.0–36.0)
MCV: 70.4 fl — AB (ref 78.0–100.0)
MONOS PCT: 7.3 % (ref 3.0–12.0)
Monocytes Absolute: 0.4 10*3/uL (ref 0.1–1.0)
NEUTROS ABS: 2.4 10*3/uL (ref 1.4–7.7)
Neutrophils Relative %: 50.6 % (ref 43.0–77.0)
PLATELETS: 311 10*3/uL (ref 150.0–400.0)
RBC: 5.31 Mil/uL — ABNORMAL HIGH (ref 3.87–5.11)
RDW: 22.8 % — AB (ref 11.5–15.5)
WBC: 4.8 10*3/uL (ref 4.0–10.5)

## 2015-07-04 LAB — BASIC METABOLIC PANEL
BUN: 14 mg/dL (ref 6–23)
CHLORIDE: 101 meq/L (ref 96–112)
CO2: 27 meq/L (ref 19–32)
Calcium: 9 mg/dL (ref 8.4–10.5)
Creatinine, Ser: 0.85 mg/dL (ref 0.40–1.20)
GFR: 93.4 mL/min (ref >60.00)
Glucose, Bld: 147 mg/dL — ABNORMAL HIGH (ref 70–99)
Potassium: 4.2 mEq/L (ref 3.5–5.1)
SODIUM: 135 meq/L (ref 135–145)

## 2015-07-04 LAB — HEPATIC FUNCTION PANEL
ALBUMIN: 4.1 g/dL (ref 3.5–5.2)
ALT: 17 U/L (ref 0–35)
AST: 12 U/L (ref 0–37)
Alkaline Phosphatase: 38 U/L — ABNORMAL LOW (ref 39–117)
Bilirubin, Direct: 0 mg/dL (ref 0.0–0.3)
Total Bilirubin: 0.4 mg/dL (ref 0.2–1.2)
Total Protein: 7.3 g/dL (ref 6.0–8.3)

## 2015-07-04 LAB — LIPID PANEL
CHOL/HDL RATIO: 6
CHOLESTEROL: 191 mg/dL (ref 0–200)
HDL: 33.5 mg/dL — AB (ref >39.00)
NonHDL: 157.3
TRIGLYCERIDES: 215 mg/dL — AB (ref 0.0–149.0)
VLDL: 43 mg/dL — AB (ref 0.0–40.0)

## 2015-07-04 LAB — TSH: TSH: 1.99 u[IU]/mL (ref 0.35–4.50)

## 2015-07-04 LAB — LDL CHOLESTEROL, DIRECT: LDL DIRECT: 123 mg/dL

## 2015-07-08 ENCOUNTER — Ambulatory Visit (INDEPENDENT_AMBULATORY_CARE_PROVIDER_SITE_OTHER): Payer: BLUE CROSS/BLUE SHIELD | Admitting: Family Medicine

## 2015-07-08 ENCOUNTER — Encounter: Payer: Self-pay | Admitting: Family Medicine

## 2015-07-08 VITALS — BP 160/100 | HR 113 | Temp 98.9°F | Ht 60.0 in | Wt 225.9 lb

## 2015-07-08 DIAGNOSIS — Z0000 Encounter for general adult medical examination without abnormal findings: Secondary | ICD-10-CM

## 2015-07-08 DIAGNOSIS — E8881 Metabolic syndrome: Secondary | ICD-10-CM | POA: Diagnosis not present

## 2015-07-08 DIAGNOSIS — E1165 Type 2 diabetes mellitus with hyperglycemia: Secondary | ICD-10-CM | POA: Insufficient documentation

## 2015-07-08 DIAGNOSIS — E785 Hyperlipidemia, unspecified: Secondary | ICD-10-CM | POA: Diagnosis not present

## 2015-07-08 DIAGNOSIS — I10 Essential (primary) hypertension: Secondary | ICD-10-CM | POA: Diagnosis not present

## 2015-07-08 DIAGNOSIS — R739 Hyperglycemia, unspecified: Secondary | ICD-10-CM

## 2015-07-08 LAB — POCT GLYCOSYLATED HEMOGLOBIN (HGB A1C): HEMOGLOBIN A1C: 6.9

## 2015-07-08 MED ORDER — AMLODIPINE BESYLATE 10 MG PO TABS: 10.0000 mg | ORAL_TABLET | Freq: Every day | ORAL | Status: DC

## 2015-07-08 NOTE — Patient Instructions (Addendum)
Iron-Rich Diet Iron is a mineral that helps your body to produce hemoglobin. Hemoglobin is a protein in your red blood cells that carries oxygen to your body's tissues. Eating too little iron may cause you to feel weak and tired, and it can increase your risk for infection. Eating enough iron is necessary for your body's metabolism, muscle function, and nervous system. Iron is naturally found in many foods. It can also be added to foods or fortified in foods. There are two types of dietary iron:  Heme iron. Heme iron is absorbed by the body more easily than nonheme iron. Heme iron is found in meat, poultry, and fish.  Nonheme iron. Nonheme iron is found in dietary supplements, iron-fortified grains, beans, and vegetables. You may need to follow an iron-rich diet if:  You have been diagnosed with iron deficiency or iron-deficiency anemia.  You have a condition that prevents you from absorbing dietary iron, such as:  Infection in your intestines.  Celiac disease. This involves long-lasting (chronic) inflammation of your intestines.  You do not eat enough iron.  You eat a diet that is high in foods that impair iron absorption.  You have lost a lot of blood.  You have heavy bleeding during your menstrual cycle.  You are pregnant. WHAT IS MY PLAN? Your health care provider may help you to determine how much iron you need per day based on your condition. Generally, when a person consumes sufficient amounts of iron in the diet, the following iron needs are met:  Men.  35-85 years old: 11 mg per day.  43-3 years old: 8 mg per day.  Women.   55-40 years old: 15 mg per day.  61-47 years old: 18 mg per day.  Over 53 years old: 8 mg per day.  Pregnant women: 27 mg per day.  Breastfeeding women: 9 mg per day. WHAT DO I NEED TO KNOW ABOUT AN IRON-RICH DIET?  Eat fresh fruits and vegetables that are high in vitamin C along with foods that are high in iron. This will help increase  the amount of iron that your body absorbs from food, especially with foods containing nonheme iron. Foods that are high in vitamin C include oranges, peppers, tomatoes, and mango.  Take iron supplements only as directed by your health care provider. Overdose of iron can be life-threatening. If you were prescribed iron supplements, take them with orange juice or a vitamin C supplement.  Cook foods in pots and pans that are made from iron.   Eat nonheme iron-containing foods alongside foods that are high in heme iron. This helps to improve your iron absorption.   Certain foods and drinks contain compounds that impair iron absorption. Avoid eating these foods in the same meal as iron-rich foods or with iron supplements. These include:  Coffee, black tea, and red wine.  Milk, dairy products, and foods that are high in calcium.  Beans, soybeans, and peas.  Whole grains.  When eating foods that contain both nonheme iron and compounds that impair iron absorption, follow these tips to absorb iron better.   Soak beans overnight before cooking.  Soak whole grains overnight and drain them before using.  Ferment flours before baking, such as using yeast in bread dough. WHAT FOODS CAN I EAT? Grains Iron-fortified breakfast cereal. Iron-fortified whole-wheat bread. Enriched rice. Sprouted grains. Vegetables Spinach. Potatoes with skin. Green peas. Broccoli. Red and green bell peppers. Fermented vegetables. Fruits Prunes. Raisins. Oranges. Strawberries. Mango. Grapefruit. Meats and Other Protein  Sources Beef liver. Oysters. Beef. Shrimp. [place]. Chicken. [place]. Sardines. Chickpeas. Nuts. Tofu. Beverages Tomato juice. Fresh orange juice. Prune juice. Hibiscus tea. Fortified instant breakfast shakes. Condiments Tahini. Fermented soy sauce. Sweets and Desserts Black-strap molasses.  Other Wheat germ. The items listed above may not be a complete list of recommended foods or  beverages. Contact your dietitian for more options. WHAT FOODS ARE NOT RECOMMENDED? Grains Whole grains. Bran cereal. Bran flour. Oats. Vegetables Artichokes. Brussels sprouts. Kale. Fruits Blueberries. Raspberries. Strawberries. Figs. Meats and Other Protein Sources Soybeans. Products made from soy protein. Dairy Milk. Cream. Cheese. Yogurt. Cottage cheese. Beverages Coffee. Black tea. Red wine. Sweets and Desserts Cocoa. Chocolate. Ice cream. Other Basil. Oregano. Parsley. The items listed above may not be a complete list of foods and beverages to avoid. Contact your dietitian for more information.   This information is not intended to replace advice given to you by your health care provider. Make sure you discuss any questions you have with your health care provider.   Document Released: 11/11/2004 Document Revised: 04/20/2014 Document Reviewed: 10/25/2013 Elsevier Interactive Patient Education 2016 [place]. Diet for Metabolic Syndrome Metabolic syndrome is a disorder that includes at least three of these conditions:  Abdominal obesity.  Too much sugar in your blood.  High blood pressure.  Higher than normal amount of fat (lipids) in your blood.  Lower than normal level of "good" cholesterol (HDL). Following a healthy diet can help to keep metabolic syndrome under control. It can also help to prevent the development of conditions that are associated with metabolic syndrome, such as diabetes, heart disease, and stroke. Along with exercise, a healthy diet:  Helps to improve the way that the body uses insulin.  Promotes weight loss. A common goal for people with this condition is to lose at least 7 to 10 percent of their starting weight. WHAT DO I NEED TO KNOW ABOUT THIS DIET?  Use the glycemic index (GI) to plan your meals. The index tells you how quickly a food will raise your blood sugar. Choose foods that have low GI values. These foods take a longer  time to raise blood sugar.  Keep track of how many calories you take in. Eating the right amount of calories will help your achieve a healthy weight.  You may want to follow a Mediterranean diet. This diet includes lots of vegetables, lean meats or fish, whole grains, fruits, and healthy oils and fats. WHAT FOODS CAN I EAT? Grains Stone-ground whole wheat. Pumpernickel bread. Whole-grain bread, crackers, tortillas, cereal, and pasta. Unsweetened oatmeal.Bulgur.Barley.Quinoa.Brown rice or wild rice. Vegetables Lettuce. Spinach. Peas. Beets. Cauliflower. Cabbage. Broccoli. Carrots. Tomatoes. Squash. Eggplant. Herbs. Peppers. Onions. Cucumbers. Brussels sprouts. Sweet potatoes. Yams. Beans. Lentils. Fruits Berries. Apples. Oranges. Grapes. Mango. Pomegranate. Kiwi. Cherries. Meats and Other Protein Sources Seafood and shellfish. Lean meats.Poultry. Tofu. Dairy Low-fat or fat-free dairy products, such as milk, yogurt, and cheese. Beverages Water. Low-fat milk. Milk alternatives, like soy milk or almond milk. Real fruit juice. Condiments Low-sugar or sugar-free ketchup, barbecue sauce, and mayonnaise. Mustard. Relish. Fats and Oils Avocado. Canola or olive oil. Nuts and nut butters.Seeds. The items listed above may not be a complete list of recommended foods or beverages. Contact your dietitian for more options.  WHAT FOODS ARE NOT RECOMMENDED? Red meat. Palm oil and coconut oil. Processed foods. Fried foods. Alcohol. Sweetened drinks, such as iced tea and soda. Sweets. Salty foods. The items listed above may not be a complete list of foods and  beverages to avoid. Contact your dietitian for more information.   This information is not intended to replace advice given to you by your health care provider. Make sure you discuss any questions you have with your health care provider.   Document Released: 08/14/2014 Document Reviewed: 08/14/2014 Elsevier Interactive Patient Education 2016  [place]. Metabolic Syndrome Metabolic syndrome is the presence of at least three factors that increase your risk of getting cardiovascular disease and diabetes. These factors are:  High blood sugar.  High blood triglyceride level.  High blood pressure.  Low levels of good blood cholesterol (high-density lipoprotein or HDL).  Excess weight around the waist. This factor is present with a waist measurement of:  More than 40 inches in men.  More than 35 inches in women. Metabolic syndrome is sometimes called insulin resistance syndrome and syndrome X. CAUSES The exact cause is not known, but genetics and lifestyle choices play a role. RISK FACTORS You are more likely to develop metabolic syndrome if:  You eat a diet high in calories and saturated fat.  You do not exercise regularly.  You are overweight.  You have a family history of metabolic syndrome.  You are Asian.  You are older in age.  You have insulin resistance.  You use any tobacco products, including cigarettes, chewing tobacco, or electronic cigarettes. SIGNS AND SYMPTOMS Metabolic syndrome has no specific symptoms. DIAGNOSIS To make a diagnosis, your health care provider will determine whether you have at least three of the factors that make up metabolic syndrome by:  Taking your blood pressure.  Measuring your waist.  Ordering blood tests. TREATMENT Treatment may include:  Lifestyle changes to reduce your risk for heart disease and stroke, such as:  Exercise.  Weight loss.  Maintaining a healthy diet.  Quitting the use of any tobacco products, including cigarettes, chewing tobacco, or electronic cigarettes.  Medicines that:  Help your body to maintain glucose control.  Reduce your blood pressure and your blood triglyceride levels. HOME CARE INSTRUCTIONS  Exercise regularly.  Maintain a healthy diet.  Do not use any tobacco products, including cigarettes, chewing tobacco, or  electronic cigarettes. If you need help quitting, ask your health care provider.  Keep all follow-up visits as directed by your health care provider. This is important.  Measure your waist regularly and record the measurement. To measure your waist:  Stand up straight.  Breathe out.  Wrap the measuring tape around the part of your waist that is just above your hipbones.  Read the measurement. SEEK MEDICAL CARE IF:  You feel very tired.  You develop excessive thirst.  You pass large quantities of urine.  You put on weight around your waist.  You have headaches over and over again.  You have a dizzy spell. SEEK IMMEDIATE MEDICAL CARE IF:  You develop sudden blurred vision.  You develop a sudden dizzy spell.  You have sudden trouble speaking or swallowing.  You have sudden weakness in your arm or leg.  You have chest pains or trouble breathing.  You feel like your heartbeat is abnormal.  You faint.   This information is not intended to replace advice given to you by your health care provider. Make sure you discuss any questions you have with your health care provider.   Document Released: 07/07/2007 Document Revised: 04/20/2014 Document Reviewed: 11/03/2013 Elsevier Interactive Patient Education 2016 [place] up repeat mammogram Try to lose some weight Establish more consistent exercise.

## 2015-07-08 NOTE — Progress Notes (Signed)
Pre visit review using our clinic review tool, if applicable. No additional management support is needed unless otherwise documented below in the visit note. 

## 2015-07-08 NOTE — Progress Notes (Signed)
Subjective:    Patient ID: Danielle Novak FeelingOyana M Reede, female    DOB: [birth year], 45 y.o.   MRN: [id number]  HPI  Patient seen for physical exam.  She's had some recent abnormal menses and has been followed by gynecology and had Mirena IUD placed 2 weeks ago to try to regulate her abnormal menses.  She has history of mild anemia probably related to ongoing menstrual loss. She has been intolerant of oral iron.   Other chronic medical problems include obesity, hypertension, hyperglycemia.  Strong family history of type 2 diabetes.   Patient is nonsmoker. No consistent exercise. Hypertension treated with amlodipine 5 mg daily. His had some recent elevated blood pressure readings up around 160 systolic. She doubled up on her own amlodipine to 10 mg and has seen improvement in her blood pressure with that. She is due for repeat mammogram. Last Pap smear less than 2 years ago normal.  Past Medical History  Diagnosis Date  . Chicken pox     age 45  . Hypertension    History reviewed. No pertinent past surgical history.  reports that she has never smoked. She has never used smokeless tobacco. She reports that she does not drink alcohol or use illicit drugs. family history includes Diabetes in her maternal grandmother; Hypertension in her mother. No Known Allergies    Review of Systems  Constitutional: Negative for fever, activity change, appetite change, fatigue and unexpected weight change.  HENT: Negative for ear pain, hearing loss, sore throat and trouble swallowing.   Eyes: Negative for visual disturbance.  Respiratory: Negative for cough and shortness of breath.   Cardiovascular: Negative for chest pain and palpitations.  Gastrointestinal: Negative for abdominal pain, diarrhea, constipation and blood in stool.  Endocrine: Negative for polydipsia and polyuria.  Genitourinary: Negative for dysuria and hematuria.  Musculoskeletal: Negative for myalgias, back pain and arthralgias.  Skin: Negative  for rash.  Neurological: Negative for dizziness, syncope and headaches.  Hematological: Negative for adenopathy.  Psychiatric/Behavioral: Negative for confusion and dysphoric mood.       Objective:   Physical Exam  Constitutional: She is oriented to person, place, and time. She appears well-developed and well-nourished.  HENT:  Head: Normocephalic and atraumatic.  Eyes: EOM are normal. Pupils are equal, round, and reactive to light.  Neck: Normal range of motion. Neck supple. No thyromegaly present.  Cardiovascular: Normal rate, regular rhythm and normal heart sounds.   No murmur heard. Pulmonary/Chest: Breath sounds normal. No respiratory distress. She has no wheezes. She has no rales.  Abdominal: Soft. Bowel sounds are normal. She exhibits no distension and no mass. There is no tenderness. There is no rebound and no guarding.  Musculoskeletal: Normal range of motion. She exhibits no edema.  Lymphadenopathy:    She has no cervical adenopathy.  Neurological: She is alert and oriented to person, place, and time. She displays normal reflexes. No cranial nerve deficit.  Skin: No rash noted.  Psychiatric: She has a normal mood and affect. Her behavior is normal. Judgment and thought content normal.          Assessment & Plan:   Physical exam. Patient will set up repeat mammogram. Last Pap smear less than 2 years ago and normal. Patient low risk. Blood pressure poorly controlled. Lose weight. Titrate amlodipine 10 mg daily. Reassess blood pressure within 2 months.   Hyperglycemia. Recent fasting glucose 147. Hemoglobin A1c today 6.9%. Discussed options including metformin versus lifestyle management with weight loss and exercise and she  prefers the latter. Will recheck A1c at follow-up   metabolic syndrome. She meets all 5 criteria. We discussed implications of this. Work on risk factor modification.   Hypertension. Poorly controlled. As above, titrate amlodipine 10 mg daily.

## 2015-08-26 ENCOUNTER — Other Ambulatory Visit: Payer: Self-pay

## 2015-08-26 DIAGNOSIS — Z1231 Encounter for screening mammogram for malignant neoplasm of breast: Secondary | ICD-10-CM

## 2015-09-04 ENCOUNTER — Ambulatory Visit
Admission: RE | Admit: 2015-09-04 | Discharge: 2015-09-04 | Disposition: A | Discharge status: Home / Self Care | Payer: BLUE CROSS/BLUE SHIELD | Source: Ambulatory Visit

## 2015-09-04 DIAGNOSIS — Z1231 Encounter for screening mammogram for malignant neoplasm of breast: Secondary | ICD-10-CM

## 2015-09-11 ENCOUNTER — Ambulatory Visit: Payer: BLUE CROSS/BLUE SHIELD | Admitting: Family Medicine

## 2016-06-19 ENCOUNTER — Telehealth: Payer: Self-pay | Admitting: Family Medicine

## 2016-06-19 ENCOUNTER — Ambulatory Visit (INDEPENDENT_AMBULATORY_CARE_PROVIDER_SITE_OTHER): Payer: BLUE CROSS/BLUE SHIELD | Admitting: Family Medicine

## 2016-06-19 ENCOUNTER — Encounter: Payer: Self-pay | Admitting: Family Medicine

## 2016-06-19 VITALS — BP 152/98 | HR 120 | Temp 102.9°F | Wt 222.3 lb

## 2016-06-19 DIAGNOSIS — R6889 Other general symptoms and signs: Secondary | ICD-10-CM

## 2016-06-19 DIAGNOSIS — J101 Influenza due to other identified influenza virus with other respiratory manifestations: Secondary | ICD-10-CM

## 2016-06-19 LAB — POCT INFLUENZA A/B
Influenza A, POC: POSITIVE — AB
Influenza B, POC: NEGATIVE

## 2016-06-19 MED ORDER — OSELTAMIVIR PHOSPHATE 75 MG PO CAPS: 75.0000 mg | ORAL_CAPSULE | Freq: Two times a day (BID) | ORAL | 0 refills | Status: DC

## 2016-06-19 NOTE — Telephone Encounter (Signed)
Unfortunately,  There are higher resistance rates to Amantadine- so I would not recommend that.  Tamiflu is the recommended treatement

## 2016-06-19 NOTE — Telephone Encounter (Signed)
Left detailed message on machine for patient 

## 2016-06-19 NOTE — Progress Notes (Signed)
Pre visit review using our clinic review tool, if applicable. No additional management support is needed unless otherwise documented below in the visit note. 

## 2016-06-19 NOTE — Progress Notes (Signed)
Subjective:     Patient ID: Danielle Novak, female   DOB: 03-09-1971, 46 y.o.   MRN: [id number]  HPI Patient seen for acute visit. Onset yesterday of body aches, headache, chills, fever, cough and mild sore throat. She works at a daycare. She states several children have been out recently there. She did not have flu vaccine this year.  His had some mild nausea but no vomiting. No reported diarrhea. Has severe body aches. Has not taken any medications this morning.  Past Medical History:  Diagnosis Date  . Chicken pox    age 46  . Hypertension    No past surgical history on file.  reports that she has never smoked. She has never used smokeless tobacco. She reports that she does not drink alcohol or use drugs. family history includes Diabetes in her maternal grandmother; Hypertension in her mother. No Known Allergies   Review of Systems  Constitutional: Positive for chills, fatigue and fever.  HENT: Positive for sore throat.   Respiratory: Positive for cough.   Gastrointestinal: Negative for vomiting.  Neurological: Positive for headaches.       Objective:   Physical Exam  Constitutional: She appears well-developed.   slightly ill but nontoxic in appearance  HENT:  Right Ear: External ear normal.  Left Ear: External ear normal.  Mouth/Throat: Oropharynx is clear and moist.  Neck: Neck supple.  Cardiovascular:  Patient is slightly tachycardic but regular-probably related to fever  Pulmonary/Chest: Effort normal and breath sounds normal. No respiratory distress. She has no wheezes. She has no rales.  Lymphadenopathy:    She has no cervical adenopathy.  Skin: No rash noted.       Assessment:     Influenza. She has positive influenza A screen    Plan:     -Stay well-hydrated -Tylenol or Motrin as needed for fever and body aches -Tamiflu 75 mgs twice daily for 5 days -Out of work until symptoms fully resolved -Schedule follow-up within one month to reassess her chronic  medical problems  Kristian CoveyBruce W Burchette MD [facility]

## 2016-06-19 NOTE — Telephone Encounter (Signed)
Pt would like to know if you could prescribe her something different for the flu the [facility]   [facility]arm[facility] [address]   Pt would like to have a call back if you will call something else in.

## 2016-06-19 NOTE — Patient Instructions (Signed)

## 2016-07-20 ENCOUNTER — Ambulatory Visit (INDEPENDENT_AMBULATORY_CARE_PROVIDER_SITE_OTHER): Payer: BLUE CROSS/BLUE SHIELD | Admitting: Family Medicine

## 2016-07-20 ENCOUNTER — Encounter: Payer: Self-pay | Admitting: Family Medicine

## 2016-07-20 VITALS — BP 162/100 | HR 78 | Temp 98.8°F | Wt 221.4 lb

## 2016-07-20 DIAGNOSIS — E8881 Metabolic syndrome: Secondary | ICD-10-CM

## 2016-07-20 DIAGNOSIS — I10 Essential (primary) hypertension: Secondary | ICD-10-CM

## 2016-07-20 DIAGNOSIS — E785 Hyperlipidemia, unspecified: Secondary | ICD-10-CM

## 2016-07-20 LAB — BASIC METABOLIC PANEL
BUN: 12 mg/dL (ref 6–23)
CHLORIDE: 100 meq/L (ref 96–112)
CO2: 26 mEq/L (ref 19–32)
Calcium: 9.3 mg/dL (ref 8.4–10.5)
Creatinine, Ser: 0.73 mg/dL (ref 0.40–1.20)
GFR: 110.8 mL/min (ref >60.00)
Glucose, Bld: 170 mg/dL — ABNORMAL HIGH (ref 70–99)
POTASSIUM: 4.2 meq/L (ref 3.5–5.1)
SODIUM: 134 meq/L — AB (ref 135–145)

## 2016-07-20 LAB — HEPATIC FUNCTION PANEL
ALK PHOS: 44 U/L (ref 39–117)
ALT: 29 U/L (ref 0–35)
AST: 19 U/L (ref 0–37)
Albumin: 4.5 g/dL (ref 3.5–5.2)
BILIRUBIN DIRECT: 0.1 mg/dL (ref 0.0–0.3)
BILIRUBIN TOTAL: 0.5 mg/dL (ref 0.2–1.2)
Total Protein: 7.2 g/dL (ref 6.0–8.3)

## 2016-07-20 LAB — LIPID PANEL
CHOL/HDL RATIO: 5
CHOLESTEROL: 183 mg/dL (ref 0–200)
HDL: 35.6 mg/dL — AB (ref >39.00)
NonHDL: 147.3
Triglycerides: 257 mg/dL — ABNORMAL HIGH (ref 0.0–149.0)
VLDL: 51.4 mg/dL — AB (ref 0.0–40.0)

## 2016-07-20 LAB — LDL CHOLESTEROL, DIRECT: Direct LDL: 110 mg/dL

## 2016-07-20 LAB — HEMOGLOBIN A1C: Hgb A1c MFr Bld: 8.3 % — ABNORMAL HIGH (ref 4.6–6.5)

## 2016-07-20 MED ORDER — VALSARTAN-HYDROCHLOROTHIAZIDE 80-12.5 MG PO TABS: 1.0000 | ORAL_TABLET | Freq: Every day | ORAL | 5 refills | Status: DC

## 2016-07-20 NOTE — Patient Instructions (Signed)
Metabolic Syndrome Metabolic syndrome is the presence of at least three factors that increase your risk of getting cardiovascular disease and diabetes. These factors are:  High blood sugar.  High blood triglyceride level.  High blood pressure.  Low levels of good blood cholesterol (high-density lipoprotein or HDL).  Excess weight around the waist. This factor is present with a waist measurement of:  More than 40 inches in men.  More than 35 inches in women. Metabolic syndrome is sometimes called insulin resistance syndrome and syndrome X. What are the causes? The exact cause is not known, but genetics and lifestyle choices play a role. What increases the risk? You are more likely to develop metabolic syndrome if:  You eat a diet high in calories and saturated fat.  You do not exercise regularly.  You are overweight.  You have a family history of metabolic syndrome.  You are Asian.  You are older in age.  You have insulin resistance.  You use any tobacco products, including cigarettes, chewing tobacco, or electronic cigarettes. What are the signs or symptoms? Metabolic syndrome has no specific symptoms. How is this diagnosed? To make a diagnosis, your health care provider will determine whether you have at least three of the factors that make up metabolic syndrome by:  Taking your blood pressure.  Measuring your waist.  Ordering blood tests. How is this treated? Treatment may include:  Lifestyle changes to reduce your risk for heart disease and stroke, such as:  Exercise.  Weight loss.  Maintaining a healthy diet.  Quitting the use of any tobacco products, including cigarettes, chewing tobacco, or electronic cigarettes.  Medicines that:  Help your body to maintain glucose control.  Reduce your blood pressure and your blood triglyceride levels. Follow these instructions at home:  Exercise regularly.  Maintain a healthy diet.  Do not use any tobacco  products, including cigarettes, chewing tobacco, or electronic cigarettes. If you need help quitting, ask your health care provider.  Keep all follow-up visits as directed by your health care provider. This is important.  Measure your waist regularly and record the measurement. To measure your waist:  Stand up straight.  Breathe out.  Wrap the measuring tape around the part of your waist that is just above your hipbones.  Read the measurement. Contact a health care provider if:  You feel very tired.  You develop excessive thirst.  You pass large quantities of urine.  You put on weight around your waist.  You have headaches over and over again.  You have a dizzy spell. Get help right away if:  You develop sudden blurred vision.  You develop a sudden dizzy spell.  You have sudden trouble speaking or swallowing.  You have sudden weakness in your arm or leg.  You have chest pains or trouble breathing.  You feel like your heartbeat is abnormal.  You faint. This information is not intended to replace advice given to you by your health care provider. Make sure you discuss any questions you have with your health care provider. Document Released: 07/07/2007 Document Revised: 09/05/2015 Document Reviewed: 11/03/2013 Elsevier Interactive Patient Education  2017 [employer] Diet for Metabolic Syndrome Metabolic syndrome is a disorder that includes at least three of these conditions:  Abdominal obesity.  Too much sugar in your blood.  High blood pressure.  Higher than normal amount of fat (lipids) in your blood.  Lower than normal level of "good" cholesterol (HDL). Following a healthy diet can help to keep metabolic syndrome  under control. It can also help to prevent the development of conditions that are associated with metabolic syndrome, such as diabetes, heart disease, and stroke. Along with exercise, a healthy diet:  Helps to improve the way that the body uses  insulin.  Promotes weight loss. A common goal for people with this condition is to lose at least 7 to 10 percent of their starting weight. What do I need to know about this diet?  Use the glycemic index (GI) to plan your meals. The index tells you how quickly a food will raise your blood sugar. Choose foods that have low GI values. These foods take a longer time to raise blood sugar.  Keep track of how many calories you take in. Eating the right amount of calories will help your achieve a healthy weight.  You may want to follow a Mediterranean diet. This diet includes lots of vegetables, lean meats or fish, whole grains, fruits, and healthy oils and fats. What foods can I eat? Grains  Stone-ground whole wheat. Pumpernickel bread. Whole-grain bread, crackers, tortillas, cereal, and pasta. Unsweetened oatmeal.Bulgur.Barley.Quinoa.Brown rice or wild rice. Vegetables  Lettuce. Spinach. Peas. Beets. Cauliflower. Cabbage. Broccoli. Carrots. Tomatoes. Squash. Eggplant. Herbs. Peppers. Onions. Cucumbers. Brussels sprouts. Sweet potatoes. Yams. Beans. Lentils. Fruits  Berries. Apples. Oranges. Grapes. Mango. Pomegranate. Kiwi. Cherries. Meats and Other Protein Sources  Seafood and shellfish. Lean meats.Poultry. Tofu. Dairy  Low-fat or fat-free dairy products, such as milk, yogurt, and cheese. Beverages  Water. Low-fat milk. Milk alternatives, like soy milk or almond milk. Real fruit juice. Condiments  Low-sugar or sugar-free ketchup, barbecue sauce, and mayonnaise. Mustard. Relish. Fats and Oils  Avocado. Canola or olive oil. Nuts and nut butters.Seeds. The items listed above may not be a complete list of recommended foods or beverages. Contact your dietitian for more options.  What foods are not recommended? Red meat. Palm oil and coconut oil. Processed foods. Fried foods. Alcohol. Sweetened drinks, such as iced tea and soda. Sweets. Salty foods. The items listed above may not be a  complete list of foods and beverages to avoid. Contact your dietitian for more information.  This information is not intended to replace advice given to you by your health care provider. Make sure you discuss any questions you have with your health care provider. Document Released: 08/14/2014 Document Revised: 08/09/2015 Document Reviewed: 04/11/2014 Elsevier Interactive Patient Education  2017 [employer].

## 2016-07-20 NOTE — Progress Notes (Signed)
Subjective:     Patient ID: Danielle Novak, female   DOB: [birth year], 46 y.o.   MRN: [id number]  HPI Patient is seen for medical follow-up. She has history of obesity, metabolic syndrome, hyperglycemia, dyslipidemia, hypertension. Currently takes amlodipine 10 mg daily as her only medication. Not monitoring blood pressure. Denies any headaches or dizziness. No chest pains. Poor compliance with diet. Not exercising regularly. She has strong family history of type 2 diabetes. Nonsmoker. Increased job stress. Denies polyuria or polydipsia.  Past Medical History:  Diagnosis Date  . Chicken pox    age 47  . Hypertension    No past surgical history on file.  reports that she has never smoked. She has never used smokeless tobacco. She reports that she does not drink alcohol or use drugs. family history includes Diabetes in her maternal grandmother; Hypertension in her mother. No Known Allergies   Review of Systems  Constitutional: Negative for fatigue.  Eyes: Negative for visual disturbance.  Respiratory: Negative for cough, chest tightness, shortness of breath and wheezing.   Cardiovascular: Negative for chest pain, palpitations and leg swelling.  Endocrine: Negative for polydipsia and polyuria.  Genitourinary: Negative for dysuria.  Neurological: Negative for dizziness, seizures, syncope, weakness, light-headedness and headaches.       Objective:   Physical Exam  Constitutional: She appears well-developed and well-nourished.  Eyes: Pupils are equal, round, and reactive to light.  Neck: Neck supple. No JVD present. No thyromegaly present.  Cardiovascular: Normal rate and regular rhythm.  Exam reveals no gallop.   Pulmonary/Chest: Effort normal and breath sounds normal. No respiratory distress. She has no wheezes. She has no rales.  Musculoskeletal: She exhibits no edema.  Neurological: She is alert.       Assessment:     #1 obesity with metabolic syndrome  #2 hypertension poorly  controlled  #3 history of hyperglycemia and probable type 2 diabetes  #4 dyslipidemia    Plan:     -Recheck labs today with hemoglobin A1c, lipid, hepatic, basic metabolic panel -Add to her amlodipine valsartan HCTZ 80/12.5 mg 1 daily -Strongly advised to lose some weight.  We discussed strategies. We suggested free app to monitor caloric intake and also start regular aerobic activity such as walking -Reassess blood pressure within one month  Kristian Covey MD [facility] at [facility]

## 2016-07-20 NOTE — Progress Notes (Signed)
Pre visit review using our clinic review tool, if applicable. No additional management support is needed unless otherwise documented below in the visit note. 

## 2016-07-24 ENCOUNTER — Telehealth: Payer: Self-pay | Admitting: Family Medicine

## 2016-07-24 MED ORDER — METFORMIN HCL 500 MG PO TABS: 500.0000 mg | ORAL_TABLET | Freq: Two times a day (BID) | ORAL | 1 refills | Status: DC

## 2016-07-24 MED ORDER — ATORVASTATIN CALCIUM 20 MG PO TABS: 20.0000 mg | ORAL_TABLET | Freq: Every day | ORAL | 1 refills | Status: DC

## 2016-07-24 NOTE — Telephone Encounter (Signed)
Danielle Novak pt returned your call °

## 2016-07-24 NOTE — Addendum Note (Signed)
Addended by: Johnella Moloney on: 07/24/2016 05:07 PM   Modules accepted: Orders

## 2016-07-27 NOTE — Telephone Encounter (Signed)
See results note. 

## 2016-07-31 ENCOUNTER — Other Ambulatory Visit: Payer: Self-pay | Admitting: Family Medicine

## 2016-08-19 ENCOUNTER — Ambulatory Visit: Payer: BLUE CROSS/BLUE SHIELD | Admitting: Family Medicine

## 2016-12-31 ENCOUNTER — Encounter: Payer: Self-pay | Admitting: Family Medicine

## 2017-01-31 ENCOUNTER — Other Ambulatory Visit: Payer: Self-pay | Admitting: Family Medicine

## 2017-05-21 ENCOUNTER — Other Ambulatory Visit: Payer: Self-pay | Admitting: Obstetrics and Gynecology

## 2017-06-05 IMAGING — US US PELVIS COMPLETE
1 series · 13 of 25 positions shown · non-contrast
Comparison: None

CLINICAL DATA: Vaginal spotting.  Dysfunctional uterine bleeding.

EXAM:
TRANSABDOMINAL AND TRANSVAGINAL ULTRASOUND OF PELVIS
TECHNIQUE: Both transabdominal and transvaginal ultrasound examinations of the
pelvis were performed. Transabdominal technique was performed for
global imaging of the pelvis including uterus, ovaries, adnexal
regions, and pelvic cul-de-sac. It was necessary to proceed with
endovaginal exam following the transabdominal exam to visualize the
uterus, endometrium, ovaries and adnexa .

[Series 1: us pelvis complete · 0.24mm/px · 13 of 80 slices shown]
[im 1/80]
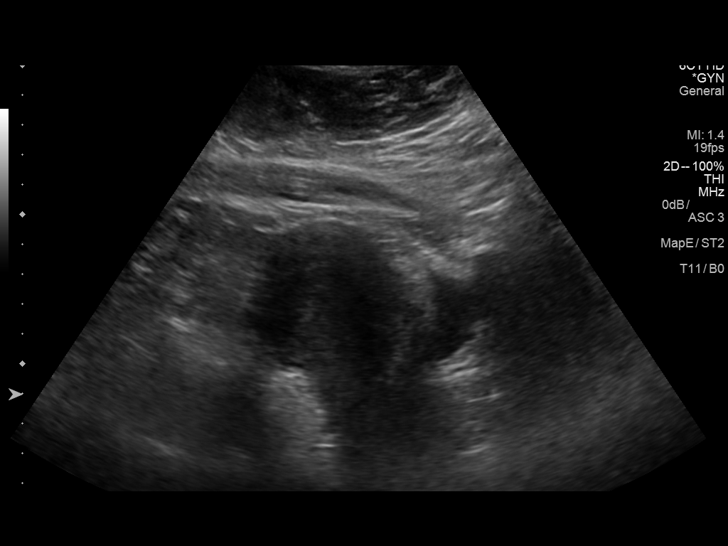
[im 7/80]
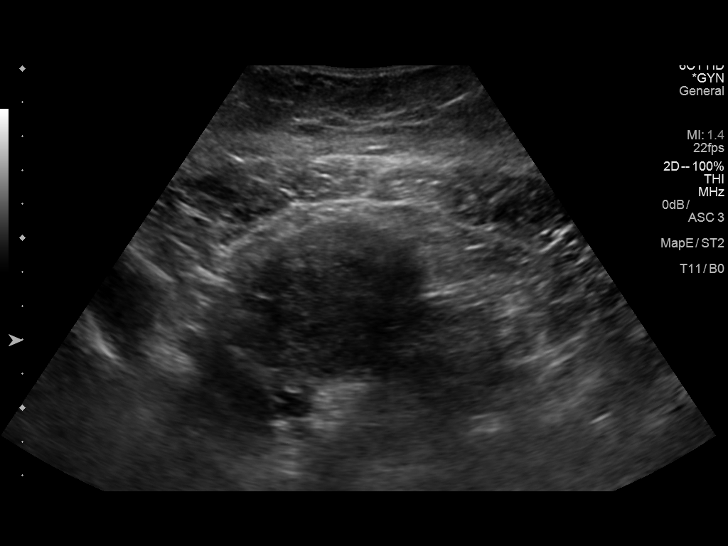
[im 14/80]
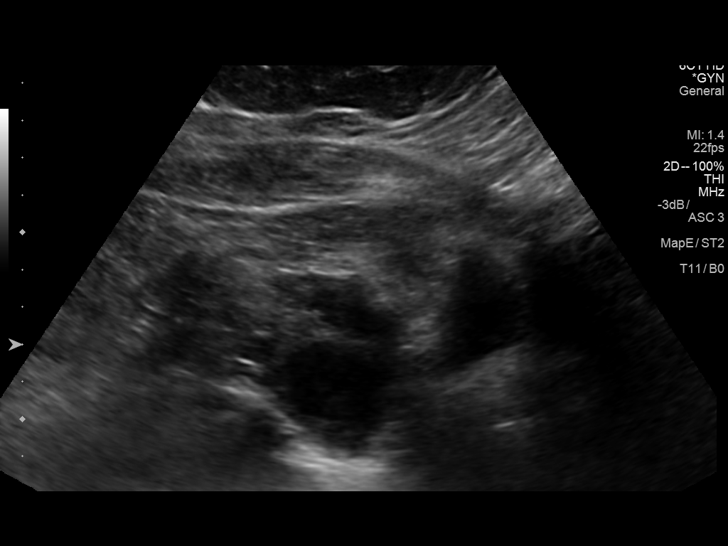
[im 20/80]
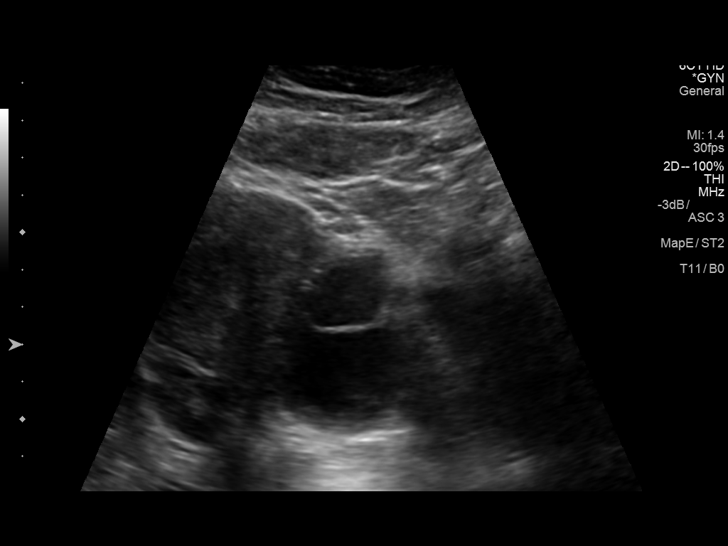
[im 27/80]
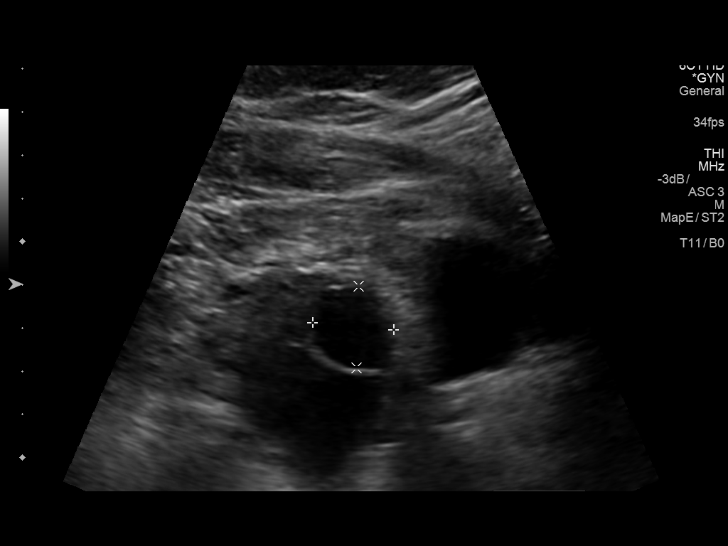
[im 33/80]
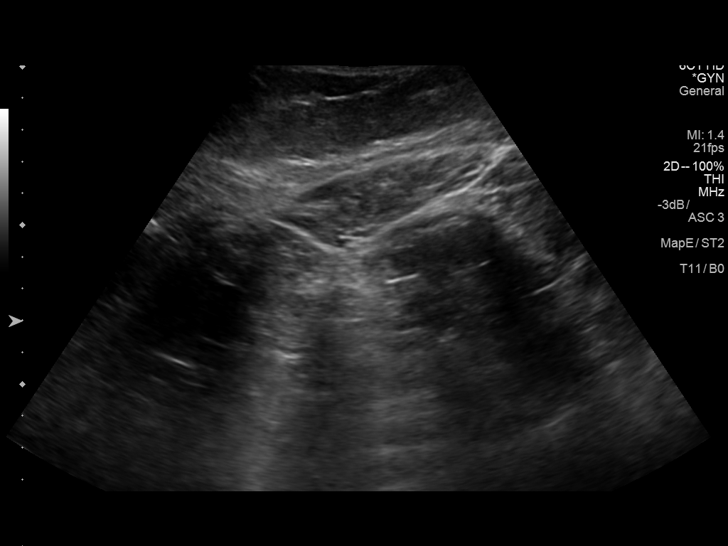
[im 40/80]
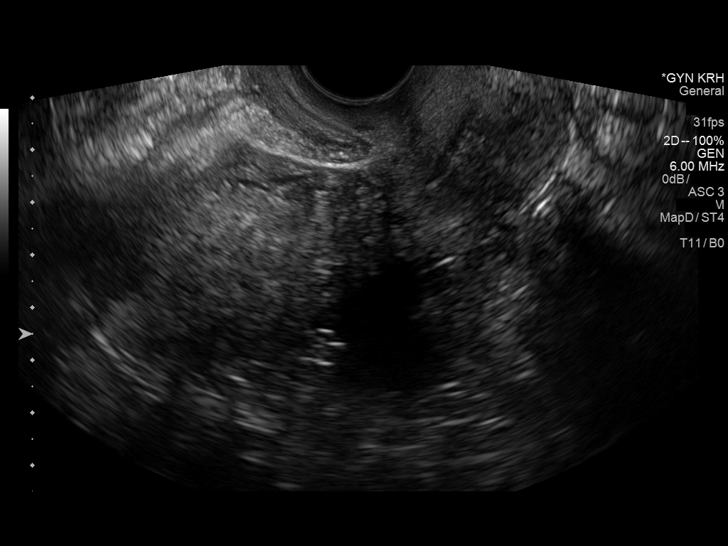
[im 47/80]
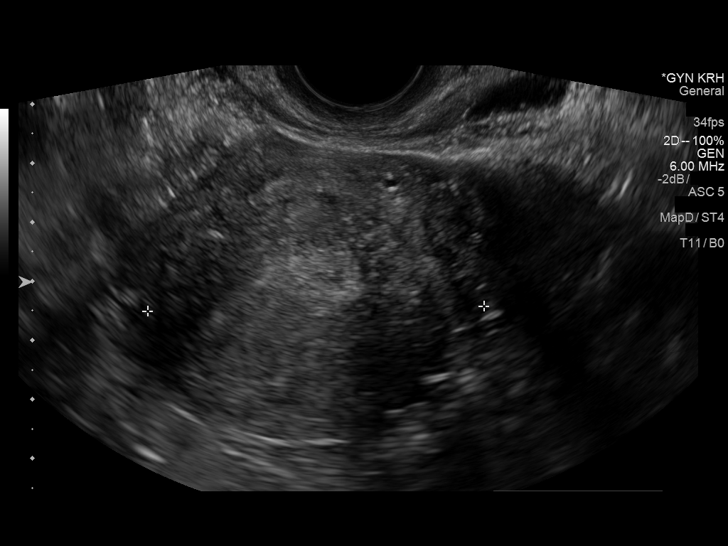
[im 53/80]
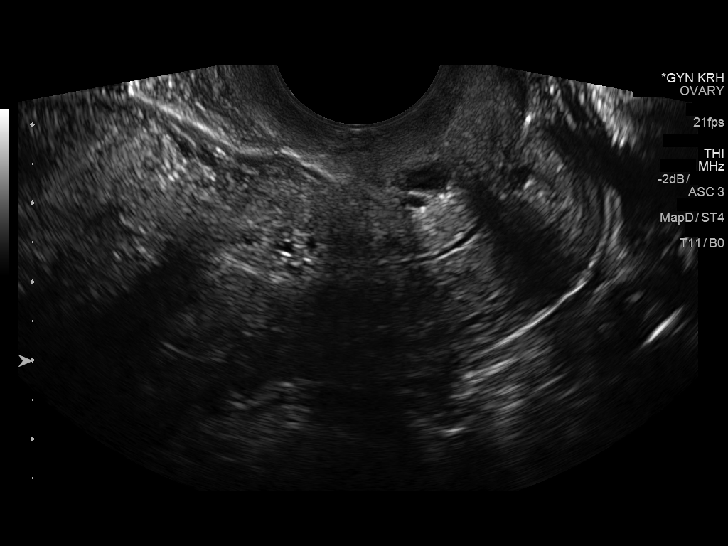
[im 60/80]
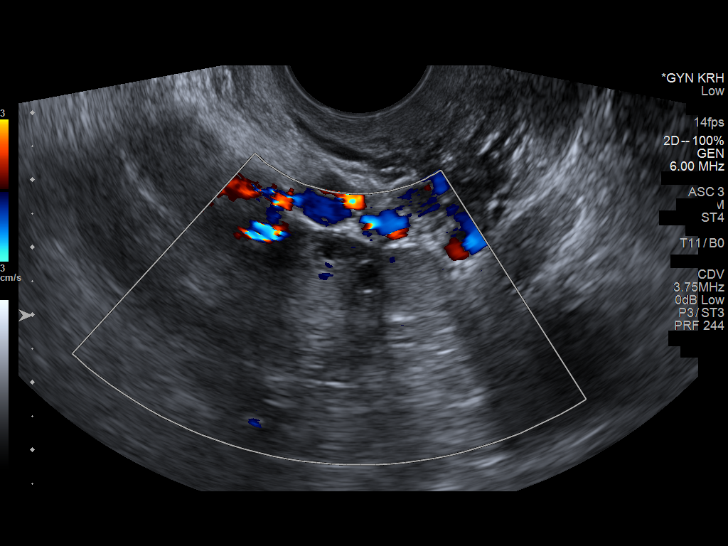
[im 66/80]
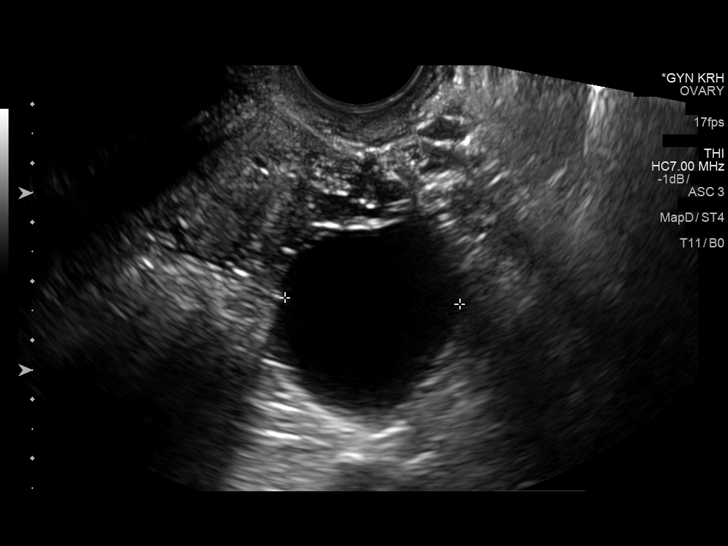
[im 73/80]
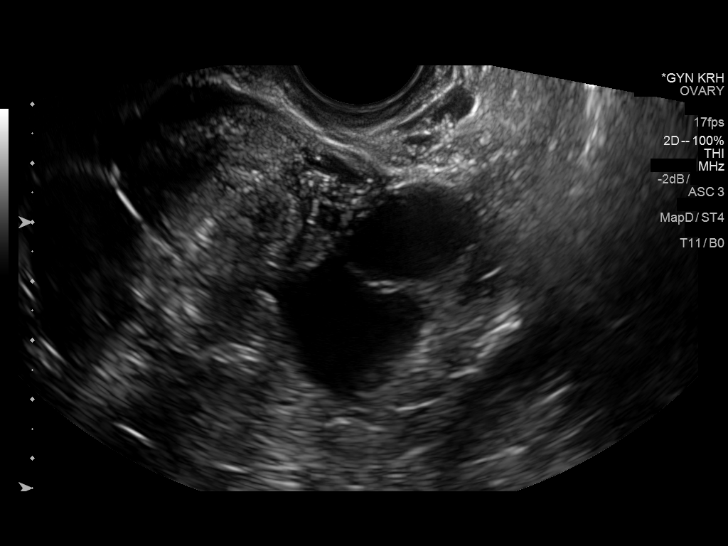
[im 80/80]
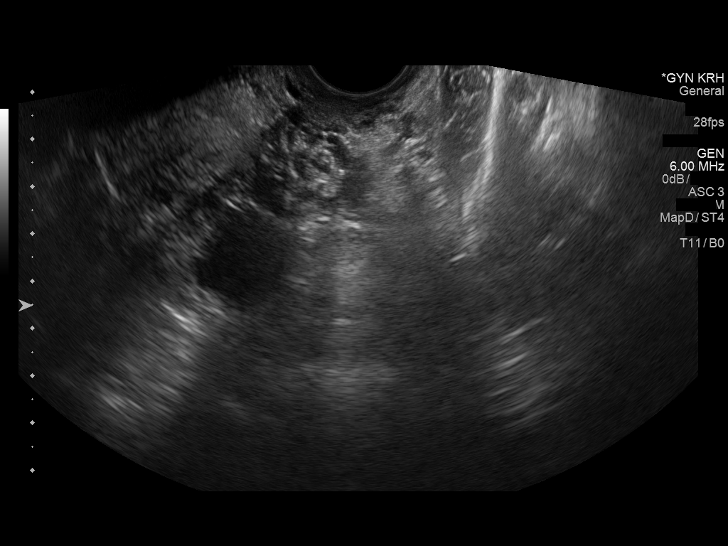

[13 of 25 positions shown; findings below may reference images not displayed]

FINDINGS: Uterus

Measurements: 10.2 x 5.1 x 5.2 cm. No fibroids or other mass
visualized.

Endometrium

Thickness: 13 mm in thickness.  No focal abnormality visualized.

Right ovary

Measurements: 3.3 x 2.2 x 2.2 cm. Normal appearance/no adnexal mass.

Left ovary

Measurements: 5.5 x 3.9 x 4.4 cm. Two simple cystic areas within the
left ovary, measuring 3.9 cm and 2.0 cm in greatest diameter. No
internal blood flow or nodularity. No septations.

Other findings

Trace free fluid in the pelvis.
IMPRESSION: Two simple appearing left ovarian cysts, the largest 3.9 cm. This is
almost certainly benign, and no specific imaging follow up is
recommended according to the Society of Radiologists in
FltrasoundJRDR Consensus Conference Statement (Nels Siyabulela et al.
Management of Asymptomatic Ovarian and Other Adnexal Cysts Imaged at
US: Society of Radiologists in Ultrasound Consensus Conference
Statement 8696. Radiology [DATE]): [id number].).

Endometrium within normal limits.

## 2017-08-03 ENCOUNTER — Other Ambulatory Visit: Payer: Self-pay | Admitting: Family Medicine

## 2017-08-04 NOTE — Telephone Encounter (Signed)
Pt is due for an office visit.    

## 2017-11-11 ENCOUNTER — Other Ambulatory Visit: Payer: Self-pay | Admitting: Family Medicine

## 2017-12-10 ENCOUNTER — Other Ambulatory Visit: Payer: Self-pay | Admitting: Family Medicine

## 2018-01-08 ENCOUNTER — Other Ambulatory Visit: Payer: Self-pay | Admitting: Family Medicine

## 2018-01-14 ENCOUNTER — Other Ambulatory Visit: Payer: Self-pay

## 2018-01-14 ENCOUNTER — Telehealth: Payer: Self-pay | Admitting: Family Medicine

## 2018-01-14 MED ORDER — AMLODIPINE BESYLATE 10 MG PO TABS: ORAL_TABLET | ORAL | 0 refills | Status: DC

## 2018-01-14 NOTE — Telephone Encounter (Signed)
Copied from CRM [id number]. Topic: Quick Communication - See Telephone Encounter >> Jan 14, 2018  9:58 AM Jolayne Haines L wrote: CRM for notification. See Telephone encounter for: 01/14/18.  Patient would like to know if Dr Caryl Never could refill her amLODipine (NORVASC) 10 MG tablet until her appointment with him on 10/8. Please advise.  [id number] [place], [place] - [phone] AT [facility] OF [facility] & [address] [place] [address]

## 2018-01-14 NOTE — Telephone Encounter (Signed)
Refill has been sent to the pharmacy.  

## 2018-01-14 NOTE — Telephone Encounter (Signed)
Refill OK

## 2018-01-14 NOTE — Telephone Encounter (Signed)
Last OV 07/20/2016, Next OV 01/18/18  Last filled on 12/10/17, # 30 with 0 refills, also note that is last refill until appointment.  Please advise.

## 2018-01-18 ENCOUNTER — Other Ambulatory Visit: Payer: Self-pay

## 2018-01-18 ENCOUNTER — Ambulatory Visit (INDEPENDENT_AMBULATORY_CARE_PROVIDER_SITE_OTHER): Payer: BLUE CROSS/BLUE SHIELD | Admitting: Family Medicine

## 2018-01-18 ENCOUNTER — Encounter: Payer: Self-pay | Admitting: Family Medicine

## 2018-01-18 VITALS — BP 162/90 | HR 87 | Temp 98.5°F | Ht <= 58 in | Wt 215.7 lb

## 2018-01-18 DIAGNOSIS — Z0000 Encounter for general adult medical examination without abnormal findings: Secondary | ICD-10-CM

## 2018-01-18 MED ORDER — VALSARTAN-HYDROCHLOROTHIAZIDE 160-25 MG PO TABS: 1.0000 | ORAL_TABLET | Freq: Every day | ORAL | 11 refills | Status: AC

## 2018-01-18 NOTE — Progress Notes (Signed)
  Subjective:     Patient ID: Danielle Novak, female   DOB: 1971-02-28, 47 y.o.   MRN: [id number]  HPI Patient is seen for physical.  She sees gynecologist yearly.  She is getting regular mammograms and Pap smears through them.  She has history of hypertension, hyperlipidemia, and type 2 diabetes.  Has not had regular follow-up regarding her diabetes.  Does not monitor sugars regularly.  She has lost about 6 pounds since last year.  Medications reviewed and compliant with all.  She does not monitor her blood sugars or blood pressure regularly.  No recent chest pains.  Last tetanus 2014.  She declines flu vaccine.  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  No alcohol use.  Non-smoker.  Past Medical History:  Diagnosis Date  . Chicken pox    age 62  . Hypertension    History reviewed. No pertinent surgical history.  reports that she has never smoked. She has never used smokeless tobacco. She reports that she does not drink alcohol or use drugs. family history includes Diabetes in her maternal grandmother; Hypertension in her mother. No Known Allergies   Review of Systems  Constitutional: Negative for activity change, appetite change, fatigue, fever and unexpected weight change.  HENT: Negative for ear pain, hearing loss, sore throat and trouble swallowing.   Eyes: Negative for visual disturbance.  Respiratory: Negative for cough and shortness of breath.   Cardiovascular: Negative for chest pain and palpitations.  Gastrointestinal: Negative for abdominal pain, blood in stool, constipation and diarrhea.  Genitourinary: Negative for dysuria and hematuria.  Musculoskeletal: Negative for arthralgias, back pain and myalgias.  Skin: Negative for rash.  Neurological: Negative for dizziness, syncope and headaches.  Hematological: Negative for adenopathy.  Psychiatric/Behavioral: Negative for confusion and dysphoric mood.       Objective:   Physical Exam   Constitutional: She is oriented to person, place, and time. She appears well-developed and well-nourished.  HENT:  Head: Normocephalic and atraumatic.  Eyes: Pupils are equal, round, and reactive to light. EOM are normal.  Neck: Normal range of motion. Neck supple. No thyromegaly present.  Cardiovascular: Normal rate, regular rhythm and normal heart sounds.  No murmur heard. Pulmonary/Chest: Breath sounds normal. No respiratory distress. She has no wheezes. She has no rales.  Abdominal: Soft. Bowel sounds are normal. She exhibits no distension and no mass. There is no tenderness. There is no rebound and no guarding.  Musculoskeletal: Normal range of motion. She exhibits no edema.  Lymphadenopathy:    She has no cervical adenopathy.  Neurological: She is alert and oriented to person, place, and time. She displays normal reflexes. No cranial nerve deficit.  Skin: No rash noted.  Psychiatric: She has a normal mood and affect. Her behavior is normal. Judgment and thought content normal.       Assessment:     #1 physical exam.  Several health maintenance issues addressed as below  #2 hypertension poorly controlled on today's visit  #3 hyperlipidemia  #4 type 2 diabetes    Plan:     -Check lab work including basic chemistries, CBC, TSH, lipid panel, A1c -She is strongly encouraged to lose weight.  Try to establish more consistent exercise -Increase valsartan and HCTZ to 160/25 mg 1 daily -Bring back to reassess blood pressure in 1 month  Kristian Covey MD [facility] at [facility]

## 2018-01-18 NOTE — Patient Instructions (Signed)
Try to lose some weight  Keep sodium intake < 2 grams per day  Get back to more regular exercise  Increase the Valsartan to 160 mg daily  Let's plan on follow up in one month

## 2018-01-19 LAB — BASIC METABOLIC PANEL
BUN: 11 mg/dL (ref 6–23)
CHLORIDE: 102 meq/L (ref 96–112)
CO2: 25 mEq/L (ref 19–32)
CREATININE: 0.8 mg/dL (ref 0.40–1.20)
Calcium: 9.4 mg/dL (ref 8.4–10.5)
GFR: 99.03 mL/min (ref >60.00)
Glucose, Bld: 151 mg/dL — ABNORMAL HIGH (ref 70–99)
Potassium: 4.2 mEq/L (ref 3.5–5.1)
SODIUM: 136 meq/L (ref 135–145)

## 2018-01-19 LAB — CBC WITH DIFFERENTIAL/PLATELET
BASOS ABS: 0 10*3/uL (ref 0.0–0.1)
BASOS PCT: 0.2 % (ref 0.0–3.0)
EOS ABS: 0.1 10*3/uL (ref 0.0–0.7)
Eosinophils Relative: 1.4 % (ref 0.0–5.0)
HEMATOCRIT: 46.2 % — AB (ref 36.0–46.0)
Hemoglobin: 15.5 g/dL — ABNORMAL HIGH (ref 12.0–15.0)
LYMPHS ABS: 2.2 10*3/uL (ref 0.7–4.0)
LYMPHS PCT: 43.7 % (ref 12.0–46.0)
MCHC: 33.5 g/dL (ref 30.0–36.0)
MCV: 85.7 fl (ref 78.0–100.0)
MONO ABS: 0.3 10*3/uL (ref 0.1–1.0)
Monocytes Relative: 5.6 % (ref 3.0–12.0)
NEUTROS ABS: 2.4 10*3/uL (ref 1.4–7.7)
Neutrophils Relative %: 49.1 % (ref 43.0–77.0)
PLATELETS: 216 10*3/uL (ref 150.0–400.0)
RBC: 5.39 Mil/uL — ABNORMAL HIGH (ref 3.87–5.11)
RDW: 13.8 % (ref 11.5–15.5)
WBC: 4.9 10*3/uL (ref 4.0–10.5)

## 2018-01-19 LAB — LIPID PANEL
CHOL/HDL RATIO: 5
CHOLESTEROL: 203 mg/dL — AB (ref 0–200)
HDL: 40.7 mg/dL (ref >39.00)
NonHDL: 162.37
TRIGLYCERIDES: 340 mg/dL — AB (ref 0.0–149.0)
VLDL: 68 mg/dL — AB (ref 0.0–40.0)

## 2018-01-19 LAB — HEMOGLOBIN A1C: HEMOGLOBIN A1C: 8 % — AB (ref 4.6–6.5)

## 2018-01-19 LAB — HEPATIC FUNCTION PANEL
ALT: 20 U/L (ref 0–35)
AST: 14 U/L (ref 0–37)
Albumin: 4.5 g/dL (ref 3.5–5.2)
Alkaline Phosphatase: 33 U/L — ABNORMAL LOW (ref 39–117)
BILIRUBIN DIRECT: 0.1 mg/dL (ref 0.0–0.3)
BILIRUBIN TOTAL: 0.4 mg/dL (ref 0.2–1.2)
Total Protein: 7.6 g/dL (ref 6.0–8.3)

## 2018-01-19 LAB — LDL CHOLESTEROL, DIRECT: Direct LDL: 123 mg/dL

## 2018-01-19 LAB — TSH: TSH: 2.24 u[IU]/mL (ref 0.35–4.50)

## 2018-01-25 ENCOUNTER — Other Ambulatory Visit: Payer: Self-pay

## 2018-01-25 MED ORDER — METFORMIN HCL 500 MG PO TABS: 1000.0000 mg | ORAL_TABLET | Freq: Two times a day (BID) | ORAL | 1 refills | Status: AC

## 2018-01-25 MED ORDER — ATORVASTATIN CALCIUM 40 MG PO TABS: 40.0000 mg | ORAL_TABLET | Freq: Every day | ORAL | 3 refills | Status: AC

## 2018-02-11 ENCOUNTER — Other Ambulatory Visit: Payer: Self-pay | Admitting: Family Medicine

## 2018-02-18 ENCOUNTER — Encounter: Payer: Self-pay | Admitting: Family Medicine

## 2018-02-18 ENCOUNTER — Ambulatory Visit (INDEPENDENT_AMBULATORY_CARE_PROVIDER_SITE_OTHER): Payer: BLUE CROSS/BLUE SHIELD | Admitting: Family Medicine

## 2018-02-18 ENCOUNTER — Other Ambulatory Visit: Payer: Self-pay

## 2018-02-18 VITALS — BP 120/82 | HR 75 | Temp 98.4°F | Ht <= 58 in | Wt 216.9 lb

## 2018-02-18 DIAGNOSIS — I10 Essential (primary) hypertension: Secondary | ICD-10-CM

## 2018-02-18 DIAGNOSIS — E785 Hyperlipidemia, unspecified: Secondary | ICD-10-CM | POA: Diagnosis not present

## 2018-02-18 DIAGNOSIS — E1165 Type 2 diabetes mellitus with hyperglycemia: Secondary | ICD-10-CM | POA: Diagnosis not present

## 2018-02-18 DIAGNOSIS — R195 Other fecal abnormalities: Secondary | ICD-10-CM | POA: Diagnosis not present

## 2018-02-18 NOTE — Progress Notes (Signed)
  Subjective:     Patient ID: Danielle Novak, female   DOB: 01/17/1971, 47 y.o.   MRN: [id number]  HPI   Patient was seen recently for medical follow-up.  She had A1c 8.0%.  We increased her metformin to 2 tablets twice daily.  Not monitoring blood sugars regularly.  She has had some loose stools intermittently but states she has a tendency toward that anyway.  This has not limited her medication thus far.  Cholesterol also suboptimally controlled.  We increased her Lipitor to 40 mg daily and she is tolerating well without side effect.  Her blood pressure is treated with amlodipine and valsartan HCTZ and has been stable.  Past Medical History:  Diagnosis Date  . Chicken pox    age 49  . Hypertension    History reviewed. No pertinent surgical history.  reports that she has never smoked. She has never used smokeless tobacco. She reports that she does not drink alcohol or use drugs. family history includes Diabetes in her maternal grandmother; Hypertension in her mother. No Known Allergies   Review of Systems  Constitutional: Negative for fatigue.  Eyes: Negative for visual disturbance.  Respiratory: Negative for cough, chest tightness, shortness of breath and wheezing.   Cardiovascular: Negative for chest pain, palpitations and leg swelling.  Gastrointestinal: Positive for diarrhea.  Endocrine: Negative for polydipsia and polyuria.  Genitourinary: Negative for dysuria.  Neurological: Negative for dizziness, seizures, syncope, weakness, light-headedness and headaches.       Objective:   Physical Exam  Constitutional: She appears well-developed and well-nourished.  Eyes: Pupils are equal, round, and reactive to light.  Neck: Neck supple. No JVD present. No thyromegaly present.  Cardiovascular: Normal rate and regular rhythm. Exam reveals no gallop.  Pulmonary/Chest: Effort normal and breath sounds normal. No respiratory distress. She has no wheezes. She has no rales.   Musculoskeletal: She exhibits no edema.  Neurological: She is alert.       Assessment:     #1 type 2 diabetes suboptimally controlled with recent A1c 8.0%  #2 loose stools probably related to her metformin therapy  #3 hyperlipidemia with recent increase in Lipitor  #4 hypertension stable and at goal    Plan:     -We discussed either reducing her metformin and adding a second drug versus change to extended release and she states she would like to continue with her current regimen and recheck A1c in a few months.  If not to goal at that point consider possible addition of GLP-1 medication  -We will plan follow-up lipids when she comes in a few months with a goal LDL less than 100  -She is strongly encouraged to try to lose some weight in the meantime.  We discussed dietary strategies.  Kristian Covey MD [facility]

## 2021-04-30 ENCOUNTER — Other Ambulatory Visit: Payer: Self-pay

## 2021-04-30 ENCOUNTER — Emergency Department (HOSPITAL_BASED_OUTPATIENT_CLINIC_OR_DEPARTMENT_OTHER)
Admission: EM | Admit: 2021-04-30 | Discharge: 2021-04-30 | Disposition: A | Discharge status: Home / Self Care | Payer: BLUE CROSS/BLUE SHIELD | Attending: Emergency Medicine | Admitting: Emergency Medicine

## 2021-04-30 ENCOUNTER — Emergency Department (HOSPITAL_BASED_OUTPATIENT_CLINIC_OR_DEPARTMENT_OTHER): Payer: BLUE CROSS/BLUE SHIELD

## 2021-04-30 ENCOUNTER — Encounter (HOSPITAL_BASED_OUTPATIENT_CLINIC_OR_DEPARTMENT_OTHER): Payer: Self-pay

## 2021-04-30 DIAGNOSIS — I10 Essential (primary) hypertension: Secondary | ICD-10-CM | POA: Insufficient documentation

## 2021-04-30 DIAGNOSIS — X500XXA Overexertion from strenuous movement or load, initial encounter: Secondary | ICD-10-CM | POA: Insufficient documentation

## 2021-04-30 DIAGNOSIS — M25512 Pain in left shoulder: Secondary | ICD-10-CM | POA: Insufficient documentation

## 2021-04-30 DIAGNOSIS — Y990 Civilian activity done for income or pay: Secondary | ICD-10-CM | POA: Diagnosis not present

## 2021-04-30 MED ORDER — DICLOFENAC SODIUM 1 % EX GEL: 2.0000 g | Freq: Four times a day (QID) | CUTANEOUS | 0 refills | Status: AC | PRN

## 2021-04-30 MED ORDER — MELOXICAM 7.5 MG PO TABS: 7.5000 mg | ORAL_TABLET | Freq: Every day | ORAL | 0 refills | Status: AC

## 2021-04-30 NOTE — ED Notes (Signed)
ED Provider at bedside. 

## 2021-04-30 NOTE — ED Provider Notes (Signed)
Emergency Department Provider Note   I have reviewed the triage vital signs and the nursing notes.   HISTORY  Chief Complaint Shoulder Pain   HPI Danielle Novak is a 51 y.o. female past medical history reviewed below presents emergency department with left shoulder pain.  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  She is developed pain in the left shoulder much worse with movement.  Pain radiates into the top of the shoulder toward the neck.  No particular injury that she can recall.  Pain is much worse with lifting the shoulder.  Pain does radiate down the arm.  She is not having any weakness or numbness in the extremity. No back or leg pain.    Past Medical History:  Diagnosis Date   Chicken pox    age 5   Hypertension     Review of Systems  Constitutional: No fever/chills Eyes: No visual changes. ENT: No sore throat. Cardiovascular: Denies chest pain. Respiratory: Denies shortness of breath. Gastrointestinal: No abdominal pain.  Musculoskeletal: Positive left shoulder/neck pain.  Skin: Negative for rash. Neurological: Negative for headaches.  ____________________________________________   PHYSICAL EXAM:  VITAL SIGNS: ED Triage Vitals  Enc Vitals Group     BP 04/30/21 0833 (!) 153/95     Pulse Rate 04/30/21 0833 74     Resp 04/30/21 0833 18     Temp 04/30/21 0833 98.6 F (37 C)     Temp Source 04/30/21 0833 Oral     SpO2 04/30/21 0833 99 %     Weight 04/30/21 0830 215 lb 9.6 oz (97.8 kg)     Height 04/30/21 0830 5' (1.524 m)   Constitutional: Alert and oriented. Well appearing and in no acute distress. Eyes: Conjunctivae are normal.  Head: Atraumatic. Nose: No congestion/rhinnorhea. Mouth/Throat: Mucous membranes are moist.   Neck: No stridor. No cervical spine tenderness to palpation. Cardiovascular: Normal rate, regular rhythm. Good peripheral circulation. Grossly normal heart sounds.   Respiratory: Normal  respiratory effort.  No retractions. Lungs CTAB. Gastrointestinal: No distention.  Musculoskeletal: Pain with abduction and internal rotation of the left shoulder.  No severe crepitus.  No tenderness at the left elbow or wrist.  No midline spine tenderness.  Neurologic:  Normal speech and language. No gross focal neurologic deficits are appreciated.  Normal strength and sensation in the bilateral upper extremities.  Skin:  Skin is warm, dry and intact. No rash noted.  _____________________________________  RADIOLOGY  DG Shoulder Left  Result Date: 04/30/2021 CLINICAL DATA:  Three weeks of pain EXAM: LEFT SHOULDER - 2+ VIEW COMPARISON:  None. FINDINGS: There is no evidence of fracture or dislocation. Mild acromioclavicular and glenohumeral arthrosis. Soft tissues are unremarkable. IMPRESSION: No fracture or dislocation of the left shoulder. Mild acromioclavicular and glenohumeral arthrosis. Electronically Signed   By: Jearld Lesch M.D.   On: 04/30/2021 09:12    ____________________________________________   PROCEDURES  Procedure(s) performed:   Procedures  None ____________________________________________   INITIAL IMPRESSION / ASSESSMENT AND PLAN / ED COURSE  Pertinent labs & imaging results that were available during my care of the patient were reviewed by me and considered in my medical decision making (see chart for details).   This patient is Presenting for Evaluation of shoulder pain, which does require a range of treatment options, and is a complaint that involves a moderate risk of morbidity and mortality.  The Differential Diagnoses include fracture, dislocation, septic joint, ACS, PE, MSK pain.  Critical Interventions- Imaging and finding a position of comfort.    Reassessment after intervention: Patient more comfortable.   I decided to review pertinent External Data, and in summary no recent ED visits for the same.   Clinical Laboratory Tests Ordered: Considered  lab testing but patient well appearing with no sign of infection. Defer labs for now.    Radiologic Tests Ordered, included left shoulder x-ray which I independently evaluated. No fracture or dislocation.   Social Determinants of Health Risk non-smoker.   Reevaluation with update and discussion with patient. Plan for sports med/ortho follow up.   Medical Decision Making: Summary:  Patient presents to the emergency department for evaluation of shoulder pain.  Imaging not consistent with fracture or dislocation.  Exam is very musculoskeletal at exceedingly low suspicion for PE or atypical ACS.  Exam is not consistent with septic joint.   Disposition: discharge   ____________________________________________  FINAL CLINICAL IMPRESSION(S) / ED DIAGNOSES  Final diagnoses:  Acute pain of left shoulder     NEW OUTPATIENT MEDICATIONS STARTED DURING THIS VISIT:  Discharge Medication List as of 04/30/2021  9:26 AM     START taking these medications   Details  diclofenac Sodium (VOLTAREN) 1 % GEL Apply 2 g topically 4 (four) times daily as needed., Starting Wed 04/30/2021, Normal    meloxicam (MOBIC) 7.5 MG tablet Take 1 tablet (7.5 mg total) by mouth daily for 14 days., Starting Wed 04/30/2021, Until Wed 05/14/2021, Normal        Note:  This document was prepared using Dragon voice recognition software and may include unintentional dictation errors.  Alona Bene, MD, [facility]    Kalev Temme, Arlyss Repress, MD 05/01/21 [phone]

## 2021-04-30 NOTE — Discharge Instructions (Signed)

## 2021-04-30 NOTE — ED Triage Notes (Signed)
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 Pt denies any CP.

## 2022-12-02 ENCOUNTER — Encounter (HOSPITAL_BASED_OUTPATIENT_CLINIC_OR_DEPARTMENT_OTHER): Payer: Self-pay

## 2022-12-02 ENCOUNTER — Emergency Department (HOSPITAL_BASED_OUTPATIENT_CLINIC_OR_DEPARTMENT_OTHER): Payer: BLUE CROSS/BLUE SHIELD

## 2022-12-02 ENCOUNTER — Other Ambulatory Visit: Payer: Self-pay

## 2022-12-02 ENCOUNTER — Emergency Department (HOSPITAL_BASED_OUTPATIENT_CLINIC_OR_DEPARTMENT_OTHER)
Admission: EM | Admit: 2022-12-02 | Discharge: 2022-12-02 | Disposition: A | Discharge status: Home / Self Care | Payer: BLUE CROSS/BLUE SHIELD | Attending: Emergency Medicine | Admitting: Emergency Medicine

## 2022-12-02 DIAGNOSIS — W01198A Fall on same level from slipping, tripping and stumbling with subsequent striking against other object, initial encounter: Secondary | ICD-10-CM | POA: Diagnosis not present

## 2022-12-02 DIAGNOSIS — I10 Essential (primary) hypertension: Secondary | ICD-10-CM | POA: Diagnosis not present

## 2022-12-02 DIAGNOSIS — Y92219 Unspecified school as the place of occurrence of the external cause: Secondary | ICD-10-CM | POA: Diagnosis not present

## 2022-12-02 DIAGNOSIS — S0993XA Unspecified injury of face, initial encounter: Secondary | ICD-10-CM

## 2022-12-02 DIAGNOSIS — Y990 Civilian activity done for income or pay: Secondary | ICD-10-CM | POA: Insufficient documentation

## 2022-12-02 DIAGNOSIS — Z79899 Other long term (current) drug therapy: Secondary | ICD-10-CM | POA: Insufficient documentation

## 2022-12-02 DIAGNOSIS — S0031XA Abrasion of nose, initial encounter: Secondary | ICD-10-CM | POA: Insufficient documentation

## 2022-12-02 DIAGNOSIS — Z794 Long term (current) use of insulin: Secondary | ICD-10-CM | POA: Diagnosis not present

## 2022-12-02 MED ORDER — ONDANSETRON 4 MG PO TBDP
4.0000 mg | ORAL_TABLET | Freq: Once | ORAL | Status: AC
  Administered 2022-12-02: 4 mg via ORAL
  Filled 2022-12-02: qty 1

## 2022-12-02 NOTE — ED Notes (Signed)
Unable to scan Zofran. Multiple attempts. Same was referred to [facility].

## 2022-12-02 NOTE — Discharge Instructions (Signed)
It was a pleasure taking part in your care today.  As discussed, the CT scan of your face was negative for any Fracture to your nose.  You most likely have continued pain to the bridge of her nose as result of the pot falling onto it.  Please take ibuprofen or Tylenol at home every 6 hours as needed for pain.  Please follow-up with your PCP for reevaluation if you feel the need to do so.  I have written you out of work for tomorrow, please take the day off.  Please return to the ED with any new or worsening signs or symptoms.

## 2022-12-02 NOTE — ED Notes (Signed)
Patient transported to CT 

## 2022-12-02 NOTE — ED Provider Notes (Signed)
[place] [facility] DEPARTMENT AT [facility] Provider Note   CSN: [id number] Arrival date & time: 12/02/22  1431     History  Chief Complaint  Patient presents with   Facial Injury    Danielle Novak is a 52 y.o. female with medical history of hypertension.  The patient presents to the ED for evaluation of facial injury.  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  She states that she was attempting to put about a 20 pound metal pot up when the pot fell backwards landing on the bridge of her nose.  She states that this pot probably fell about 3 to 4 feet and struck her in the face.  She denies loss of consciousness, denies blood thinning medication.  She is here complaining of nausea, pain to her face.  Denies painful EOMs, loss of consciousness, one-sided weakness or numbness.  Denies neck pain, back pain.   Facial Injury Associated symptoms: no headaches        Home Medications Prior to Admission medications   Medication Sig Start Date End Date Taking? Authorizing Provider  amLODipine (NORVASC) 10 MG tablet TAKE 1 TABLET BY MOUTH DAILY 02/14/18   Burchette, Elberta Fortis, MD  atorvastatin (LIPITOR) 40 MG tablet Take 1 tablet (40 mg total) by mouth daily. 01/25/18   Burchette, Elberta Fortis, MD  diclofenac Sodium (VOLTAREN) 1 % GEL Apply 2 g topically 4 (four) times daily as needed. 04/30/21   Long, Arlyss Repress, MD  diphenhydrAMINE (BENADRYL) 12.5 MG/5ML liquid Take 12.5 mg by mouth 4 (four) times daily as needed.    [provider]  Dulaglutide (TRULICITY) 0.75 MG/0.5ML SOPN Inject into the skin. 12/19/20   [provider]  fexofenadine-pseudoephedrine (ALLEGRA-D 24) 180-240 MG per 24 hr tablet Take 1 tablet by mouth daily.    [provider]  lisinopril-hydrochlorothiazide (ZESTORETIC) 20-25 MG tablet Take 1 tablet by mouth daily. 05/21/20   [provider]  metFORMIN (GLUCOPHAGE) 500 MG tablet Take 2 tablets (1,000 mg total) by mouth 2 (two)  times daily with a meal. 01/25/18   Burchette, Elberta Fortis, MD  valsartan-hydrochlorothiazide (DIOVAN HCT) 160-25 MG tablet Take 1 tablet by mouth daily. 01/18/18   Burchette, Elberta Fortis, MD      Allergies    Patient has no known allergies.    Review of Systems   Review of Systems  Neurological:  Negative for dizziness, syncope, weakness and headaches.  All other systems reviewed and are negative.   Physical Exam Updated Vital Signs BP (!) 178/100 (BP Location: Left Arm)   Pulse 83   Temp 98.6 F (37 C)   Resp 18   Ht 5' (1.524 m)   Wt 94.3 kg   SpO2 100%   BMI 40.62 kg/m  Physical Exam Vitals and nursing note reviewed.  Constitutional:      General: She is not in acute distress.    Appearance: Normal appearance. She is not ill-appearing, toxic-appearing or diaphoretic.  HENT:     Head: Normocephalic and atraumatic.     Nose:     Comments: Superficial abrasion to bridge of nose.  No erythema, no deformity.  There is tenderness throughout the nose. Eyes:     Extraocular Movements: Extraocular movements intact.     Conjunctiva/sclera: Conjunctivae normal.     Pupils: Pupils are equal, round, and reactive to light.  Cardiovascular:     Rate and Rhythm: Normal rate and regular rhythm.  Pulmonary:  Effort: Pulmonary effort is normal.     Breath sounds: No wheezing.  Abdominal:     General: Abdomen is flat.     Tenderness: There is no abdominal tenderness.  Musculoskeletal:     Cervical back: Normal range of motion and neck supple. No tenderness.  Skin:    General: Skin is warm and dry.     Capillary Refill: Capillary refill takes less than 2 seconds.  Neurological:     General: No focal deficit present.     Mental Status: She is alert and oriented to person, place, and time.     GCS: GCS eye subscore is 4. GCS verbal subscore is 5. GCS motor subscore is 6.     Cranial Nerves: Cranial nerves 2-12 are intact. No cranial nerve deficit.     Sensory: Sensation is intact. No  sensory deficit.     Motor: Motor function is intact. No weakness.     Coordination: Coordination is intact. Heel to [facility] Test normal.     Comments: Reassuring neurological examination, no focal neurodeficits     ED Results / Procedures / Treatments   Labs (all labs ordered are listed, but only abnormal results are displayed) Labs Reviewed - No data to display  EKG None  Radiology CT Maxillofacial Wo Contrast  Result Date: 12/02/2022 CLINICAL DATA:  Facial trauma, blunt Pot fell on bridge of patient's nose EXAM: CT MAXILLOFACIAL WITHOUT CONTRAST TECHNIQUE: Multidetector CT imaging of the maxillofacial structures was performed. Multiplanar CT image reconstructions were also generated. RADIATION DOSE REDUCTION: This exam was performed according to the departmental dose-optimization program which includes automated exposure control, adjustment of the mA and/or kV according to patient size and/or use of iterative reconstruction technique. COMPARISON:  None Available. FINDINGS: Osseous: No fracture or mandibular dislocation. No destructive process. Orbits: Negative. No traumatic or inflammatory finding. Sinuses: Clear Soft tissues: Negative Limited intracranial: No significant or unexpected finding. IMPRESSION: No facial or orbital fracture. Electronically Signed   By: Charlett Nose M.D.   On: 12/02/2022 17:01    Procedures Procedures   Medications Ordered in ED Medications  ondansetron (ZOFRAN-ODT) disintegrating tablet 4 mg (4 mg Oral Given 12/02/22 1455)    ED Course/ Medical Decision Making/ A&P  Medical Decision Making Amount and/or Complexity of Data Reviewed Radiology: ordered.   52 year old female presents to ED for evaluation.  Please see HPI for further details.  On examination the patient has a superficial abrasion to the bridge of her nose.  Her nose has no obvious deformity, there is no erythema.  There is tenderness throughout the nose.  Her neurological examination is  reassuring.  Her EOMs are intact and nonpainful.  CT maxillofacial does not show any evidence of nasal bone fracture.  Patient advised to take ibuprofen or Tylenol in the interim.  Patient advised to follow-up with PCP.  Discharged in stable condition.   Final Clinical Impression(s) / ED Diagnoses Final diagnoses:  Facial injury, initial encounter    Rx / DC Orders ED Discharge Orders     None         Al Decant, PA-C 12/02/22 1711    Horton, Clabe Seal, DO 12/03/22 1711

## 2022-12-02 NOTE — ED Triage Notes (Signed)
The patient was putting a pot into a cabinet above her head. She stated it fell and hit her on the bridge of her nose. No LOC. No blood thinner use.
